# Patient Record
Sex: Male | Born: 1995
Health system: Southern US, Community
[De-identification: ages and names within clinical notes are randomized; demographics above are authoritative.]

## PROBLEM LIST (undated history)

## (undated) DIAGNOSIS — F32A Depression, unspecified: Secondary | ICD-10-CM

## (undated) DIAGNOSIS — F419 Anxiety disorder, unspecified: Secondary | ICD-10-CM

## (undated) DIAGNOSIS — M459 Ankylosing spondylitis of unspecified sites in spine: Secondary | ICD-10-CM

## (undated) DIAGNOSIS — M461 Sacroiliitis, not elsewhere classified: Secondary | ICD-10-CM

## (undated) DIAGNOSIS — F329 Major depressive disorder, single episode, unspecified: Secondary | ICD-10-CM

## (undated) HISTORY — PX: NO PAST SURGERIES: SHX2092

## (undated) HISTORY — DX: Major depressive disorder, single episode, unspecified: F32.9

## (undated) HISTORY — DX: Ankylosing spondylitis of unspecified sites in spine: M45.9

## (undated) HISTORY — DX: Depression, unspecified: F32.A

## (undated) HISTORY — DX: Anxiety disorder, unspecified: F41.9

---

## 2014-06-28 ENCOUNTER — Ambulatory Visit (HOSPITAL_COMMUNITY): Payer: Self-pay | Admitting: Psychiatry

## 2015-01-02 ENCOUNTER — Other Ambulatory Visit: Payer: Self-pay | Admitting: Orthopedic Surgery

## 2015-01-02 DIAGNOSIS — M24159 Other articular cartilage disorders, unspecified hip: Secondary | ICD-10-CM

## 2015-01-02 DIAGNOSIS — M169 Osteoarthritis of hip, unspecified: Principal | ICD-10-CM

## 2015-01-17 ENCOUNTER — Ambulatory Visit
Admission: RE | Admit: 2015-01-17 | Discharge: 2015-01-17 | Disposition: A | Discharge status: Home / Self Care | Payer: BLUE CROSS/BLUE SHIELD | Source: Ambulatory Visit | Attending: Orthopedic Surgery | Admitting: Orthopedic Surgery

## 2015-01-17 DIAGNOSIS — M24159 Other articular cartilage disorders, unspecified hip: Secondary | ICD-10-CM

## 2015-01-17 DIAGNOSIS — M169 Osteoarthritis of hip, unspecified: Principal | ICD-10-CM

## 2015-01-17 MED ORDER — IOHEXOL 180 MG/ML  SOLN
15.0000 mL | Freq: Once | INTRAMUSCULAR | Status: DC | PRN
  Administered 2015-01-17: 15 mL via INTRAVENOUS

## 2015-01-21 ENCOUNTER — Other Ambulatory Visit: Payer: Self-pay

## 2015-02-05 ENCOUNTER — Ambulatory Visit
Admission: RE | Admit: 2015-02-05 | Discharge: 2015-02-05 | Disposition: A | Discharge status: Home / Self Care | Payer: BLUE CROSS/BLUE SHIELD | Source: Ambulatory Visit | Attending: Orthopedic Surgery | Admitting: Orthopedic Surgery

## 2015-02-05 DIAGNOSIS — M169 Osteoarthritis of hip, unspecified: Principal | ICD-10-CM

## 2015-02-05 DIAGNOSIS — M24159 Other articular cartilage disorders, unspecified hip: Secondary | ICD-10-CM

## 2015-02-05 MED ORDER — IOHEXOL 180 MG/ML  SOLN
9.0000 mL | Freq: Once | INTRAMUSCULAR | Status: AC | PRN
  Administered 2015-02-05: 9 mL via INTRA_ARTICULAR

## 2016-02-25 ENCOUNTER — Encounter: Payer: Self-pay | Admitting: Rheumatology

## 2016-10-12 ENCOUNTER — Emergency Department (HOSPITAL_COMMUNITY)
Admission: EM | Admit: 2016-10-12 | Discharge: 2016-10-12 | Disposition: A | Discharge status: Home / Self Care | Payer: BLUE CROSS/BLUE SHIELD | Attending: Physician Assistant | Admitting: Physician Assistant

## 2016-10-12 DIAGNOSIS — Z0000 Encounter for general adult medical examination without abnormal findings: Secondary | ICD-10-CM

## 2016-10-12 DIAGNOSIS — Z0389 Encounter for observation for other suspected diseases and conditions ruled out: Secondary | ICD-10-CM | POA: Insufficient documentation

## 2016-10-12 LAB — LIPASE, BLOOD: Lipase: 33 U/L (ref 11–51)

## 2016-10-12 LAB — CBC
HCT: 43 % (ref 39.0–52.0)
Hemoglobin: 14.9 g/dL (ref 13.0–17.0)
MCH: 31.8 pg (ref 26.0–34.0)
MCHC: 34.7 g/dL (ref 30.0–36.0)
MCV: 91.7 fL (ref 78.0–100.0)
PLATELETS: 232 10*3/uL (ref 150–400)
RBC: 4.69 MIL/uL (ref 4.22–5.81)
RDW: 13.1 % (ref 11.5–15.5)
WBC: 5.2 10*3/uL (ref 4.0–10.5)

## 2016-10-12 LAB — COMPREHENSIVE METABOLIC PANEL
ALT: 12 U/L — AB (ref 17–63)
AST: 25 U/L (ref 15–41)
Albumin: 4.3 g/dL (ref 3.5–5.0)
Alkaline Phosphatase: 57 U/L (ref 38–126)
Anion gap: 9 (ref 5–15)
BILIRUBIN TOTAL: 0.6 mg/dL (ref 0.3–1.2)
BUN: 8 mg/dL (ref 6–20)
CO2: 26 mmol/L (ref 22–32)
CREATININE: 0.82 mg/dL (ref 0.61–1.24)
Calcium: 9.7 mg/dL (ref 8.9–10.3)
Chloride: 103 mmol/L (ref 101–111)
GFR calc Af Amer: >60 mL/min (ref >60)
GLUCOSE: 85 mg/dL (ref 65–99)
Potassium: 4 mmol/L (ref 3.5–5.1)
Sodium: 138 mmol/L (ref 135–145)
Total Protein: 7 g/dL (ref 6.5–8.1)

## 2016-10-12 NOTE — Discharge Instructions (Signed)
Do not hesitate to return to the emergency room for any new, worsening or concerning symptoms. ° °Please obtain primary care using resource guide below. Let them know that you were seen in the emergency room and that they will need to obtain records for further outpatient management. ° ° °

## 2016-10-12 NOTE — ED Triage Notes (Signed)
Pt states for the last week he has been noticing when he drinks beer he has pain in his RUQ, also states he had mono a few weeks ago and is concerned about his liver.

## 2016-10-12 NOTE — ED Provider Notes (Signed)
[facility]-EMERGENCY DEPT Provider Note   CSN: [id number] Arrival date & time: 10/12/16  1242     History   Chief Complaint Chief Complaint  Patient presents with  . Abdominal Pain     HPI  Blood pressure 112/66, pulse 92, temperature 98 F (36.7 C), temperature source Oral, resp. rate 16, height 5\' 11"  (1.803 m), weight 72.6 kg (160 lb), SpO2 100 %.  Carl Lewis is a 21 y.o. male complaining of liver inflammation. He was diagnosed with mono 2 months ago and he states when he drinks alcohol he feels that his right upper quadrant becomes puffy. There is some mild discomfort but no pain, no fever, chills, nausea or vomiting. He has normal appetite and normal bowel and bladder habits. No acetaminophen use.  No past medical history on file.  There are no active problems to display for this patient.   No past surgical history on file.     Home Medications    Prior to Admission medications   Not on File    Family History No family history on file.  Social History Social History  Substance Use Topics  . Smoking status: Not on file  . Smokeless tobacco: Not on file  . Alcohol use Not on file     Allergies   Patient has no known allergies.   Review of Systems Review of Systems  A complete review of systems was obtained and all systems are negative except as noted in the HPI and PMH.   Physical Exam Updated Vital Signs BP 112/66 (BP Location: Right Arm)   Pulse 92   Temp 98 F (36.7 C) (Oral)   Resp 16   Ht 5\' 11"  (1.803 m)   Wt 72.6 kg (160 lb)   SpO2 100%   BMI 22.32 kg/m   Physical Exam  Constitutional: He is oriented to person, place, and time. He appears well-developed and well-nourished. No distress.  HENT:  Head: Normocephalic and atraumatic.  Mouth/Throat: Oropharynx is clear and moist.  Eyes: Pupils are equal, round, and reactive to light. Conjunctivae and EOM are normal.  Neck: Normal range of motion.  Cardiovascular: Normal rate, regular  rhythm and intact distal pulses.   Pulmonary/Chest: Effort normal and breath sounds normal.  Abdominal: Soft. He exhibits no distension and no mass. There is no tenderness. There is no rebound and no guarding. No hernia.  Musculoskeletal: Normal range of motion.  Neurological: He is alert and oriented to person, place, and time.  Skin: Capillary refill takes less than 2 seconds. He is not diaphoretic.  Psychiatric: He has a normal mood and affect.  Nursing note and vitals reviewed.    ED Treatments / Results  Labs (all labs ordered are listed, but only abnormal results are displayed) Labs Reviewed  COMPREHENSIVE METABOLIC PANEL - Abnormal; Notable for the following:       Result Value   ALT 12 (*)    All other components within normal limits  LIPASE, BLOOD  CBC  URINALYSIS, ROUTINE W REFLEX MICROSCOPIC    EKG  EKG Interpretation None       Radiology No results found.  Procedures Procedures (including critical care time)  Medications Ordered in ED Medications - No data to display   Initial Impression / Assessment and Plan / ED Course  I have reviewed the triage vital signs and the nursing notes.  Pertinent labs & imaging results that were available during my care of the patient were reviewed by me and considered in  my medical decision making (see chart for details).     Vitals:   10/12/16 1307  BP: 112/66  Pulse: 92  Resp: 16  Temp: 98 F (36.7 C)  TempSrc: Oral  SpO2: 100%  Weight: 72.6 kg (160 lb)  Height: 5\' 11"  (1.803 m)    Medications - No data to display  Carl Lewis is 21 y.o. male presenting with Concern for liver inflammation, was diagnosed with mononucleosis 2 months ago. He feels that when he drinks alcohol the right upper quadrant swells, there is no real pain, fever, nausea vomiting, change in bowel or bladder habits. Blood work, vital signs and physical exam reassuring. Of advised him to avoid alcohol and acetaminophen for several months,  primary care referral given.  Evaluation does not show pathology that would require ongoing emergent intervention or inpatient treatment. Pt is hemodynamically stable and mentating appropriately. Discussed findings and plan with patient/guardian, who agrees with care plan. All questions answered. Return precautions discussed and outpatient follow up given.   Upon discharge patient states that he is been advised that he will need imaging, I've advised him that we can obtain a CAT scan or ultrasound here today but I don't feel that it is medically necessary. I have offered him imaging and he declines. He states that he would like a definitive diagnosis, we have had a very frank discussion in that I don't feel that there is any significant abnormality requiring any emergent intervention at this time. I have again offered him further testing and imaging and he has declined.    Final Clinical Impressions(s) / ED Diagnoses   Final diagnoses:  Normal physical exam    New Prescriptions New Prescriptions   No medications on file     Kaylyn Limisciotta, Wyona Neils, PA-C 10/12/16 1636    Adelyn Roscher, Joni Reiningicole, PA-C 10/12/16 1654    Mackuen, Cindee Saltourteney Lyn, MD 10/12/16 [phone]

## 2016-10-12 NOTE — ED Notes (Signed)
The PA discussed the patient's lab results with the patient and offered to order further testing at the patient's request, but the patient declined. DC instructions reviewed with patient and pt now understands his DC instructions and will follow up at [facility].

## 2016-10-12 NOTE — ED Notes (Signed)
Pt displeased that he is not getting a CT or an Ultrasound, PA informed and now at bedside.

## 2016-10-12 NOTE — ED Notes (Signed)
PA advised pt did not need to be on monitor.

## 2016-10-31 ENCOUNTER — Other Ambulatory Visit: Payer: Self-pay | Admitting: Family Medicine

## 2016-10-31 ENCOUNTER — Encounter: Payer: Self-pay | Admitting: Family Medicine

## 2016-11-02 ENCOUNTER — Ambulatory Visit: Payer: BLUE CROSS/BLUE SHIELD | Admitting: Family Medicine

## 2016-11-16 ENCOUNTER — Ambulatory Visit: Payer: Medicaid Other | Admitting: Family Medicine

## 2016-11-17 IMAGING — MR MR HIP*R* W/CM
4 of 5 series · 23 of 40 positions shown · IV contrast (agent unspecified)
Comparison: MRI left hip 01/17/2015 reviewed.

CLINICAL DATA: Bilateral hip popping and pain for 1 year. Running
and walking long distances makes the patient's symptoms worse. No
known injury. Initial encounter.

EXAM:
MRI OF THE RIGHT HIP WITH CONTRAST(MR Arthrogram)
TECHNIQUE: Multiplanar, multisequence MR imaging of the hip was performed
immediately following contrast injection into the hip joint under
fluoroscopic guidance. No intravenous contrast was administered.

[Series 8: T2 fat-sat · coronal · right · 3.0mm · 0.78mm/px · 9 of 30 slices shown]
[im 1/30]
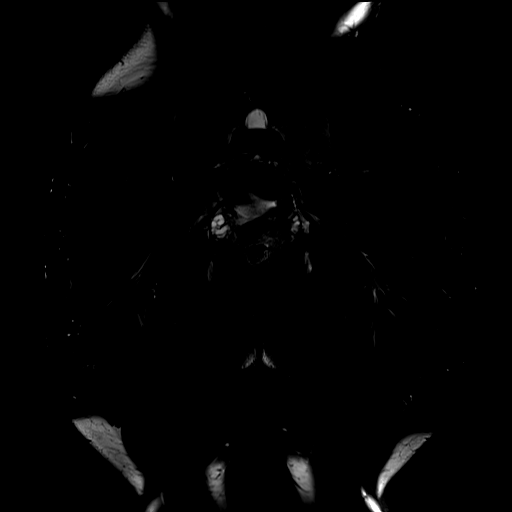
[im 4/30]
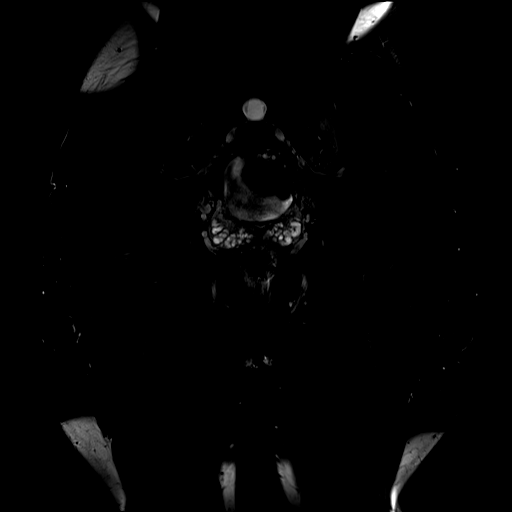
[im 8/30]
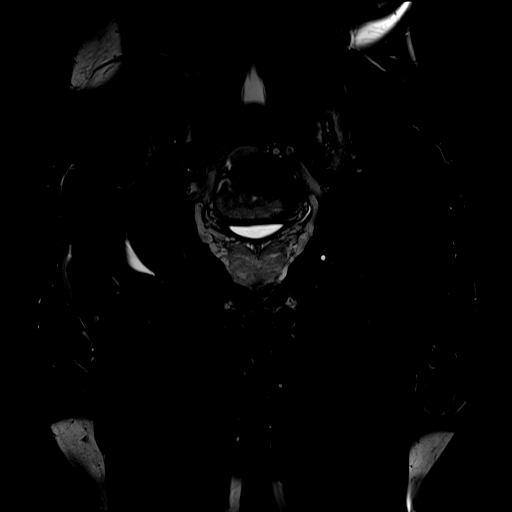
[im 11/30]
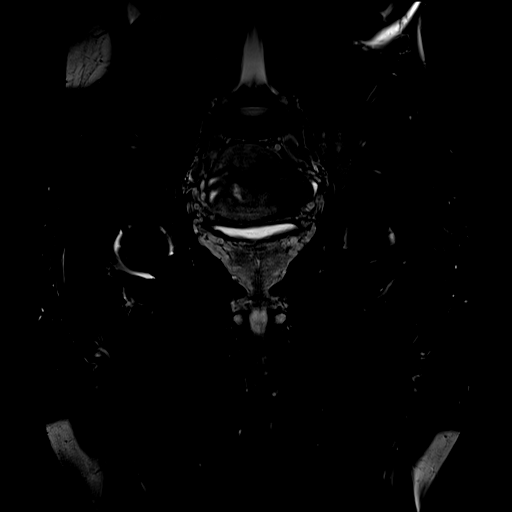
[im 15/30]
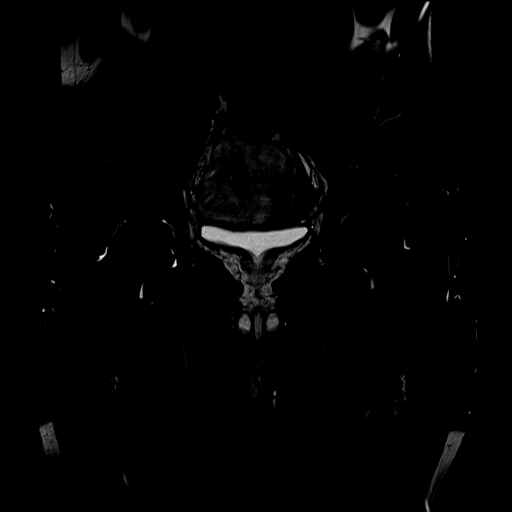
[im 19/30]
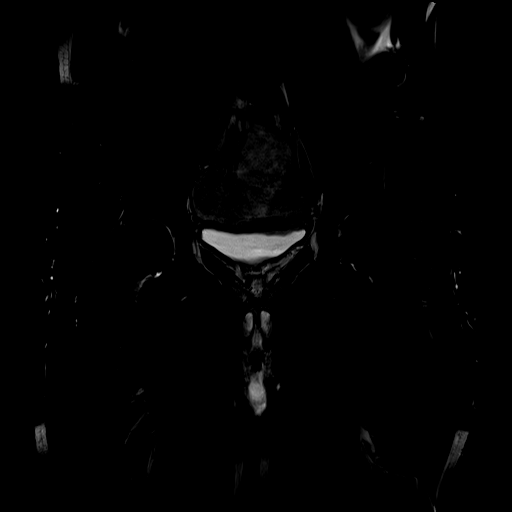
[im 22/30]
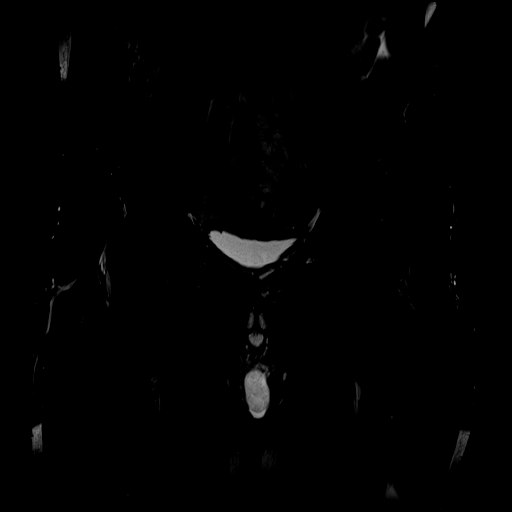
[im 26/30]
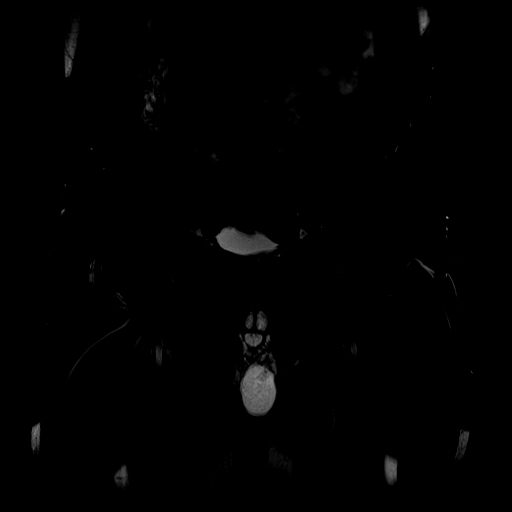
[im 30/30]
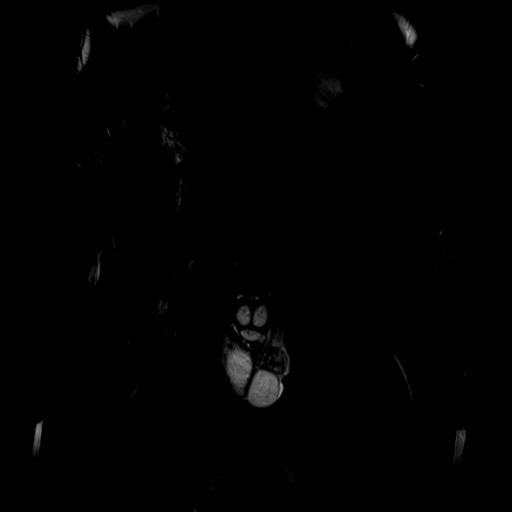

[Series 9: T1 · coronal · right · 3.0mm · 0.78mm/px · 8 of 30 slices shown]
[im 1/30]
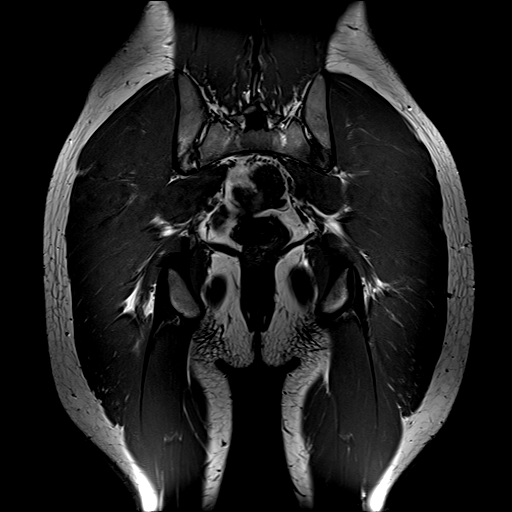
[im 5/30]
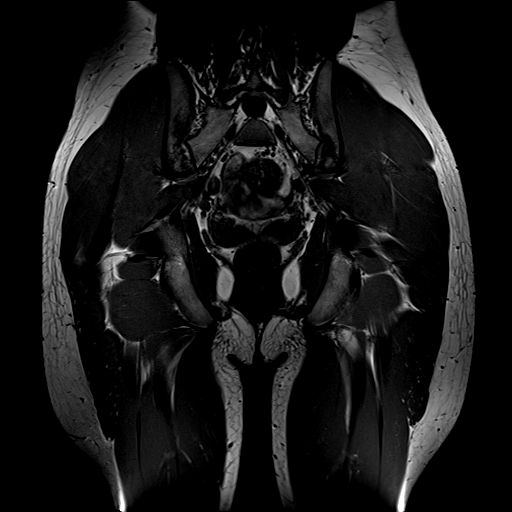
[im 9/30]
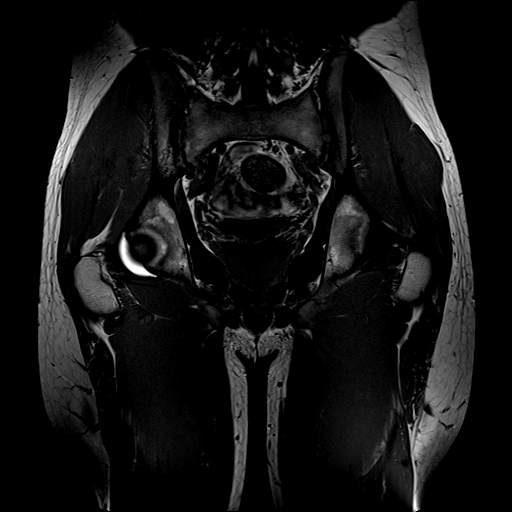
[im 13/30]
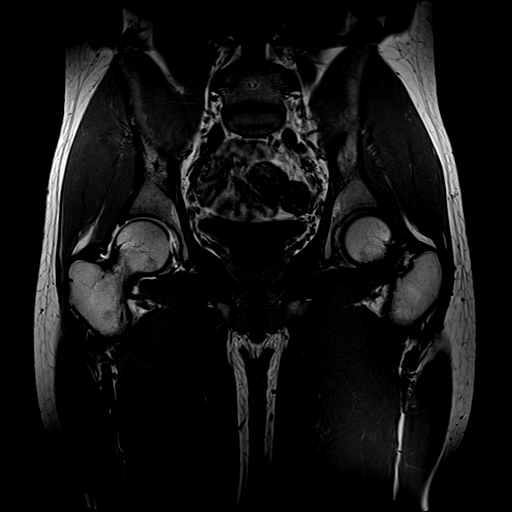
[im 17/30]
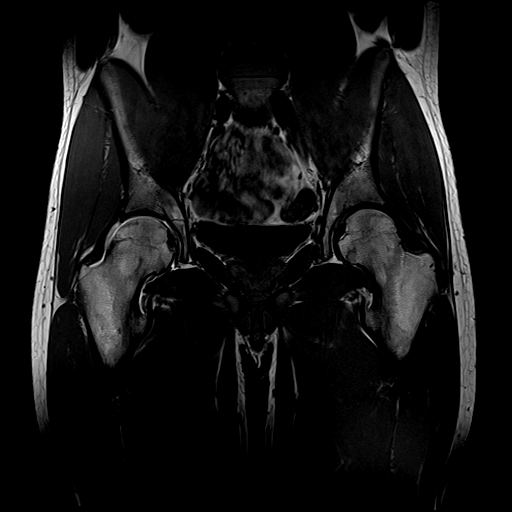
[im 21/30]
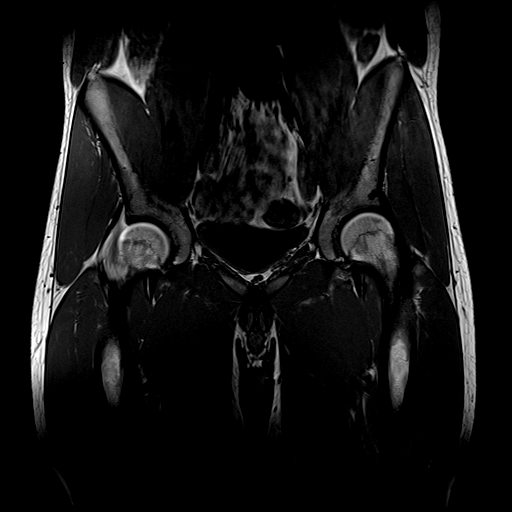
[im 25/30]
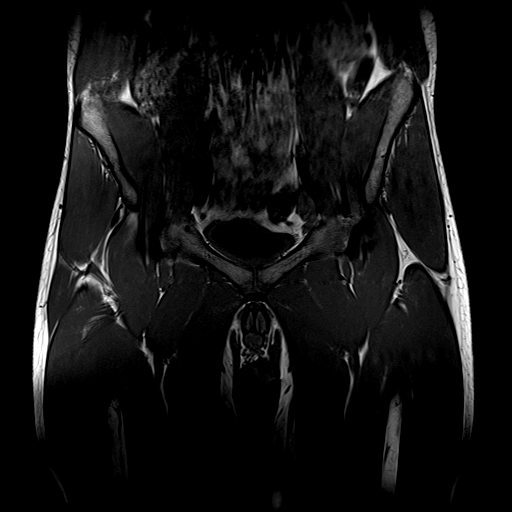
[im 30/30]
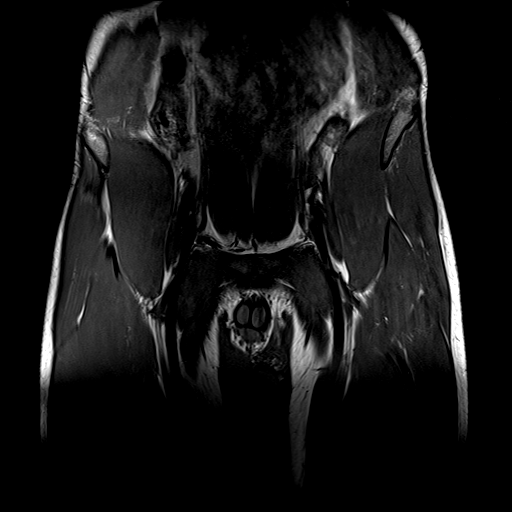

[Series 10: T1 fat-sat · axial · right · 3.0mm · 0.62mm/px · z∈[-98,-28]mm · 3 of 31 slices shown (1 of 2)]
[im 5/31]
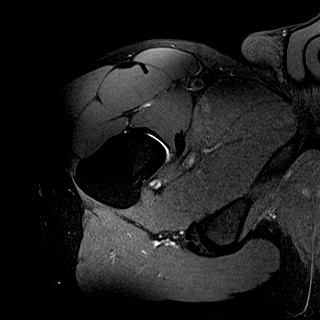
[im 18/31]
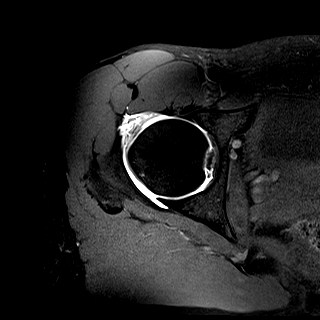
[im 26/31]
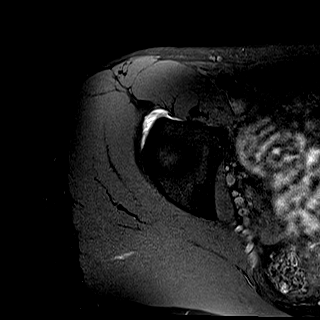

[Series 11: T1 fat-sat · coronal · right · 3.0mm · 0.62mm/px · 3 of 27 slices shown (2 of 2)]
[im 5/27]
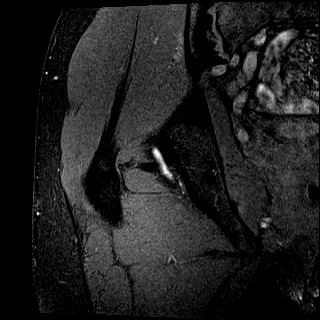
[im 14/27]
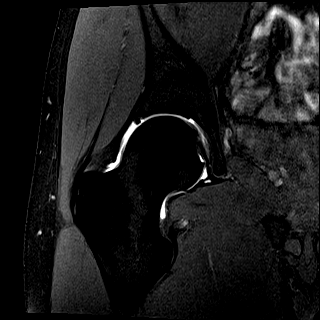
[im 22/27]
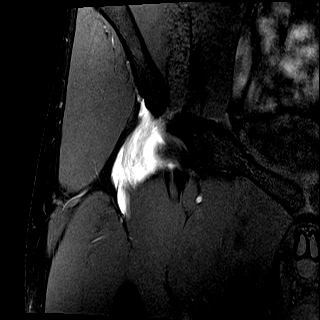

[23 of 40 positions shown; findings below may reference images not displayed]

FINDINGS: Bones: Erosions present about the sacroiliac joints bilaterally and
there is marrow edema about the joints. Changes may be slightly
worse on the left.

Articular cartilage and labrum

Articular cartilage:  Unremarkable.

Labrum:  Intact.

Bursal effusion

Bursae:  Unremarkable.

Muscles and tendons

Muscles and tendons:  Intact and normal appearance.

Other findings

Miscellaneous:  Imaged intrapelvic contents are unremarkable.
IMPRESSION: Dominant finding is bilateral sacroiliitis which may be slightly
worse on the left. Finding is worrisome for spondyloarthropathy such
as reactive arthritis or ankylosing spondylitis.

Negative for tear of the acetabular labrum.

## 2016-11-21 ENCOUNTER — Emergency Department (HOSPITAL_COMMUNITY)
Admission: EM | Admit: 2016-11-21 | Discharge: 2016-11-22 | Disposition: A | Discharge status: Home / Self Care | Payer: Self-pay | Attending: Emergency Medicine | Admitting: Emergency Medicine

## 2016-11-21 ENCOUNTER — Encounter (HOSPITAL_COMMUNITY): Payer: Self-pay | Admitting: Emergency Medicine

## 2016-11-21 DIAGNOSIS — R1011 Right upper quadrant pain: Secondary | ICD-10-CM | POA: Insufficient documentation

## 2016-11-21 DIAGNOSIS — F1020 Alcohol dependence, uncomplicated: Secondary | ICD-10-CM | POA: Insufficient documentation

## 2016-11-21 DIAGNOSIS — F17210 Nicotine dependence, cigarettes, uncomplicated: Secondary | ICD-10-CM | POA: Insufficient documentation

## 2016-11-21 DIAGNOSIS — R45851 Suicidal ideations: Secondary | ICD-10-CM | POA: Insufficient documentation

## 2016-11-21 DIAGNOSIS — R5383 Other fatigue: Secondary | ICD-10-CM | POA: Insufficient documentation

## 2016-11-21 DIAGNOSIS — J45909 Unspecified asthma, uncomplicated: Secondary | ICD-10-CM | POA: Insufficient documentation

## 2016-11-21 HISTORY — DX: Sacroiliitis, not elsewhere classified: M46.1

## 2016-11-21 LAB — COMPREHENSIVE METABOLIC PANEL
ALBUMIN: 4.4 g/dL (ref 3.5–5.0)
ALT: 13 U/L — AB (ref 17–63)
AST: 25 U/L (ref 15–41)
Alkaline Phosphatase: 57 U/L (ref 38–126)
Anion gap: 10 (ref 5–15)
BUN: 13 mg/dL (ref 6–20)
CHLORIDE: 102 mmol/L (ref 101–111)
CO2: 25 mmol/L (ref 22–32)
CREATININE: 0.95 mg/dL (ref 0.61–1.24)
Calcium: 9.3 mg/dL (ref 8.9–10.3)
GFR calc Af Amer: >60 mL/min (ref >60)
GFR calc non Af Amer: >60 mL/min (ref >60)
GLUCOSE: 113 mg/dL — AB (ref 65–99)
Potassium: 3.5 mmol/L (ref 3.5–5.1)
SODIUM: 137 mmol/L (ref 135–145)
Total Bilirubin: 0.4 mg/dL (ref 0.3–1.2)
Total Protein: 7.5 g/dL (ref 6.5–8.1)

## 2016-11-21 LAB — CBC
HCT: 42.2 % (ref 39.0–52.0)
Hemoglobin: 14.3 g/dL (ref 13.0–17.0)
MCH: 31.2 pg (ref 26.0–34.0)
MCHC: 33.9 g/dL (ref 30.0–36.0)
MCV: 91.9 fL (ref 78.0–100.0)
PLATELETS: 227 10*3/uL (ref 150–400)
RBC: 4.59 MIL/uL (ref 4.22–5.81)
RDW: 12.6 % (ref 11.5–15.5)
WBC: 5.9 10*3/uL (ref 4.0–10.5)

## 2016-11-21 LAB — RAPID URINE DRUG SCREEN, HOSP PERFORMED
AMPHETAMINES: NOT DETECTED
BENZODIAZEPINES: NOT DETECTED
Barbiturates: NOT DETECTED
Cocaine: NOT DETECTED
OPIATES: NOT DETECTED
TETRAHYDROCANNABINOL: NOT DETECTED

## 2016-11-21 LAB — LIPASE, BLOOD: LIPASE: 30 U/L (ref 11–51)

## 2016-11-21 LAB — URINALYSIS, ROUTINE W REFLEX MICROSCOPIC
Bilirubin Urine: NEGATIVE
GLUCOSE, UA: NEGATIVE mg/dL
HGB URINE DIPSTICK: NEGATIVE
KETONES UR: NEGATIVE mg/dL
Leukocytes, UA: NEGATIVE
Nitrite: NEGATIVE
PH: 6 (ref 5.0–8.0)
PROTEIN: NEGATIVE mg/dL
Specific Gravity, Urine: 1.008 (ref 1.005–1.030)

## 2016-11-21 LAB — ETHANOL: Alcohol, Ethyl (B): <5 mg/dL (ref <5)

## 2016-11-21 LAB — ACETAMINOPHEN LEVEL: Acetaminophen (Tylenol), Serum: <10 ug/mL — ABNORMAL LOW (ref 10–30)

## 2016-11-21 LAB — SALICYLATE LEVEL: Salicylate Lvl: <7 mg/dL (ref 2.8–30.0)

## 2016-11-21 NOTE — ED Notes (Signed)
Belongings removed, pt changed into paper scrubs. Wanded by security. Staffing has been called, no sitter til AM. Consulting civil engineer aware.

## 2016-11-21 NOTE — ED Notes (Signed)
Pt has multiple questions r/t his RUQ abdominal pain.  Pt reports pain is worse after ETOH consumption as well as the consumption of certain foods.  Pt has multiple concerns about developing diabetes or permanently damaging his pancreas or liver.  Offered support to pt.  Suggested that pt abstain from ETOH and the foods that cause the pain as well as become established with a PCP for follow up.  Pt states that his SI is passive, however does have plan to hang himself.  Pt is calm and cooperative at this time.  Will continue to monitor.

## 2016-11-21 NOTE — ED Notes (Signed)
Pt reports he has had RUQ pain X several months, has been told there is nothing wrong but pain persists. Dull, 7/10. Denies N/V/D.  Also during triage questioning, when asked it pt was having thoughts of hurting himself he stated yes, he sometimes has a plan to hang himself. SI precautions initiated.

## 2016-11-22 NOTE — ED Notes (Signed)
TTS in progress at this time.  

## 2016-11-22 NOTE — ED Triage Notes (Signed)
PT may be DC home
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
## 2016-11-22 NOTE — Discharge Instructions (Signed)
Please follow up with the outpatient services recommended by the psychiatry team

## 2016-11-22 NOTE — Progress Notes (Signed)
Per Fransisca Kaufmann, NP, the patient does not meet criteria for inpatient treatment. Patient is recommended for discharge and to follow up with outpatient providers.    Patient requesting resources for outpatient services.   CSW faxed over resources to [facility] at [phone].    Celine Ahr, RN notified.   Baldo Daub MSW, LCSWA CSW Disposition [phone]

## 2016-11-22 NOTE — ED Provider Notes (Signed)
[facility]-EMER[facility]ENCY DEPT Provider Note   CSN: [id number] Arrival date & time: 11/21/16  2018     History   Chief Complaint Chief Complaint  Patient presents with  . Abdominal Pain  . Suicidal    HPI Carl Lewis is a 21 y.o. male.  The patient presents to the emergency department for evaluation of abdominal discomfort in RUQ that has been ongoing for about 3 months. He states his RUQ abdomen "swells" after he drinks alcohol. For the past 2 weeks he has noticed that shortly after he eats, he feels drained and has no energy. He has lost 6-7 pounds in the past 4 months. No nausea, vomiting, diarrhea or bowel movement changes. No fever, cough, SOB. He states that he notices symptoms of fatigue after eating things like bread or sugar. He also feels it causes an inability to get an erection. He states a concern for whether or not his drinking alcohol is contributing to a change in his body that is affecting him. He reports he does not drink everyday. He drinks every couple of days, sometimes "a beer or two, and sometimes a lot".   During routine screening through triage he answered "yes" to whether or not he wanted to hurt himself. He states that he would rather not continue to live if he was going to experience these symptoms related to diet. He further states he has considered how he would commit suicide, stating he would hang himself.     The history is provided by the patient. No language interpreter was used.  Abdominal Pain   Pertinent negatives include fever, diarrhea, nausea, vomiting, constipation, headaches and myalgias.    Past Medical History:  Diagnosis Date  . Anxiety and depression   . Sacroiliitis (HCC)     There are no active problems to display for this patient.   History reviewed. No pertinent surgical history.     Home Medications    Prior to Admission medications   Not on File    Family History Family History  Problem Relation Age of Onset  . Asthma  Mother   . Anxiety disorder Mother     Social History Social History  Substance Use Topics  . Smoking status: Current Some Day Smoker  . Smokeless tobacco: Never Used  . Alcohol use Yes     Comment: 5/week     Allergies   Patient has no known allergies.   Review of Systems Review of Systems  Constitutional: Positive for fatigue and unexpected weight change (See HPI.). Negative for chills and fever.  HENT: Negative.   Respiratory: Negative.  Negative for shortness of breath.   Cardiovascular: Negative.  Negative for chest pain.  Gastrointestinal: Positive for abdominal pain (No significant pain, just swelling and mild pressure). Negative for constipation, diarrhea, nausea and vomiting.  Genitourinary: Negative.   Musculoskeletal: Negative.  Negative for myalgias.  Skin: Negative.   Neurological: Negative.  Negative for syncope and headaches.     Physical Exam Updated Vital Signs BP (!) 125/93 (BP Location: Left Arm)   Pulse 97   Temp 98.7 F (37.1 C) (Oral)   Resp 18   Ht  (1.803 m)   Wt 68 kg (150 lb)   SpO2 99%   BMI 20.92 kg/m   Physical Exam  Constitutional: He is oriented to person, place, and time. He appears well-developed and well-nourished.  HENT:  Head: Normocephalic.  Neck: Normal range of motion. Neck supple.  Cardiovascular: Normal rate and regular rhythm.  Pulmonary/Chest: Effort normal and breath sounds normal. He has no wheezes. He has no rales.  Abdominal: Soft. Bowel sounds are normal. There is no hepatosplenomegaly. There is no tenderness. There is no rebound and no guarding.  Musculoskeletal: Normal range of motion.  Neurological: He is alert and oriented to person, place, and time.  Skin: Skin is warm and dry. No rash noted.  Psychiatric: He has a normal mood and affect.     ED Treatments / Results  Labs (all labs ordered are listed, but only abnormal results are displayed) Labs Reviewed  COMPREHENSIVE METABOLIC PANEL -  Abnormal; Notable for the following:       Result Value   Glucose, Bld 113 (*)    ALT 13 (*)    All other components within normal limits  URINALYSIS, ROUTINE W REFLEX MICROSCOPIC - Abnormal; Notable for the following:    Color, Urine STRAW (*)    All other components within normal limits  ACETAMINOPHEN LEVEL - Abnormal; Notable for the following:    Acetaminophen (Tylenol), Serum <10 (*)    All other components within normal limits  LIPASE, BLOOD  CBC  ETHANOL  SALICYLATE LEVEL  RAPID URINE DRUG SCREEN, HOSP PERFORMED    EKG  EKG Interpretation None       Radiology No results found.  Procedures Procedures (including critical care time)  Medications Ordered in ED Medications - No data to display   Initial Impression / Assessment and Plan / ED Course  I have reviewed the triage vital signs and the nursing notes.  Pertinent labs & imaging results that were available during my care of the patient were reviewed by me and considered in my medical decision making (see chart for details).     The patient is concerned about alcohol intake, RUQ symptoms per HPI, and symptoms he is experiencing related to eating certain foods. Chart reviewed. He was seen for similar concerns in August of this year. He has not had outpatient follow up.   No significant weight loss. No abnormal labs, including LFT's. VS normal. Discussed at length dietary intake, recent changes, possible resolutions. Discussed he should stop drinking for a period of weeks to see if this affects his symptoms. Also discussed the importance in outpatient follow up.  He does not feel unsafe going home. However, it is apparent he has considered suicide to the point of determining how he would carry it out. He has no psychiatric history, is in school for Counselling psychologist and has future plans. No HI/AVH. Will request TTS evaluation to determine safety for discharge.   Final Clinical Impressions(s) / ED Diagnoses    Final diagnoses:  None   1. Fatigue 2. SI  New Prescriptions New Prescriptions   No medications on file     Danne Harbor 11/22/16 Harle Battiest, MD 11/22/16 [phone]

## 2016-11-22 NOTE — ED Provider Notes (Signed)
Outpatient treatment recommended by TTS.   Stable for discharge.   Linwood Dibbles, MD 11/22/16 1213

## 2016-11-22 NOTE — ED Notes (Signed)
Declined W/C at D/C and was escorted to lobby by RN. 

## 2016-11-22 NOTE — ED Notes (Signed)
Per Ala Dach from [facility], pt meets inpatient criteria.  BH does not have a bed at this time so pt will be awaiting placement.
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
## 2016-11-22 NOTE — ED Notes (Signed)
A Regular Diet was ordered for Lunch, and Patient asked for a cup of water w/ No Ice.

## 2017-07-09 ENCOUNTER — Emergency Department (HOSPITAL_COMMUNITY)
Admission: EM | Admit: 2017-07-09 | Discharge: 2017-07-09 | Disposition: A | Discharge status: Home / Self Care | Payer: 59 | Attending: Emergency Medicine | Admitting: Emergency Medicine

## 2017-07-09 ENCOUNTER — Encounter (HOSPITAL_COMMUNITY): Payer: Self-pay | Admitting: Emergency Medicine

## 2017-07-09 ENCOUNTER — Emergency Department (HOSPITAL_COMMUNITY): Payer: 59

## 2017-07-09 ENCOUNTER — Other Ambulatory Visit: Payer: Self-pay

## 2017-07-09 DIAGNOSIS — R079 Chest pain, unspecified: Secondary | ICD-10-CM | POA: Diagnosis present

## 2017-07-09 DIAGNOSIS — R0781 Pleurodynia: Secondary | ICD-10-CM

## 2017-07-09 DIAGNOSIS — F17210 Nicotine dependence, cigarettes, uncomplicated: Secondary | ICD-10-CM | POA: Diagnosis not present

## 2017-07-09 DIAGNOSIS — Z79899 Other long term (current) drug therapy: Secondary | ICD-10-CM | POA: Insufficient documentation

## 2017-07-09 DIAGNOSIS — R071 Chest pain on breathing: Secondary | ICD-10-CM | POA: Diagnosis not present

## 2017-07-09 LAB — BASIC METABOLIC PANEL
Anion gap: 9 (ref 5–15)
BUN: 13 mg/dL (ref 6–20)
CALCIUM: 9.8 mg/dL (ref 8.9–10.3)
CO2: 27 mmol/L (ref 22–32)
Chloride: 102 mmol/L (ref 101–111)
Creatinine, Ser: 0.93 mg/dL (ref 0.61–1.24)
GFR calc Af Amer: >60 mL/min (ref >60)
GLUCOSE: 100 mg/dL — AB (ref 65–99)
Potassium: 3.5 mmol/L (ref 3.5–5.1)
SODIUM: 138 mmol/L (ref 135–145)

## 2017-07-09 LAB — CBC
HEMATOCRIT: 39.4 % (ref 39.0–52.0)
Hemoglobin: 13.8 g/dL (ref 13.0–17.0)
MCH: 32.4 pg (ref 26.0–34.0)
MCHC: 35 g/dL (ref 30.0–36.0)
MCV: 92.5 fL (ref 78.0–100.0)
PLATELETS: 240 10*3/uL (ref 150–400)
RBC: 4.26 MIL/uL (ref 4.22–5.81)
RDW: 11.8 % (ref 11.5–15.5)
WBC: 8.5 10*3/uL (ref 4.0–10.5)

## 2017-07-09 LAB — I-STAT TROPONIN, ED: TROPONIN I, POC: 0 ng/mL (ref 0.00–0.08)

## 2017-07-09 LAB — D-DIMER, QUANTITATIVE: D-Dimer, Quant: 0.38 ug/mL-FEU (ref 0.00–0.50)

## 2017-07-09 MED ORDER — NAPROXEN 500 MG PO TABS: 500.0000 mg | ORAL_TABLET | Freq: Two times a day (BID) | ORAL | 0 refills | Status: DC

## 2017-07-09 MED ORDER — KETOROLAC TROMETHAMINE 60 MG/2ML IM SOLN
30.0000 mg | Freq: Once | INTRAMUSCULAR | Status: AC
  Administered 2017-07-09: 30 mg via INTRAMUSCULAR
  Filled 2017-07-09: qty 2

## 2017-07-09 NOTE — ED Notes (Signed)
Nurse starting IV and will collect labs. 

## 2017-07-09 NOTE — Discharge Instructions (Addendum)
Your work-up today did not reveal a concerning cause of your chest pain.  We suspect that your symptoms may be due to chest wall inflammation.  Take naproxen as prescribed for symptomatic management.  You may also try over-the-counter Salonpas patches with lidocaine to apply topically for additional pain control.  Drink plenty of water as dehydration can contribute to symptoms.  Follow-up with a primary care doctor to ensure resolution of your symptoms.

## 2017-07-09 NOTE — ED Provider Notes (Signed)
[facility] EMERGENCY DEPARTMENT Provider Note   CSN: [id number] Arrival date & time: 07/09/17  0132    History   Chief Complaint Chief Complaint  Patient presents with  . Chest Pain    HPI Carl Lewis is a 22 y.o. male.  22 year old male presents to the emergency department for evaluation of chest pain.  He reports a sharp chest pain which is in his right lower chest and aggravated with deep breathing.  He states that he took ibuprofen prior to arrival with little improvement in his discomfort.  Symptoms began upon waking at 10 AM yesterday.  He has had a history of similar discomfort on a few occasions.  He states that this pain usually resolves spontaneously.  He has not had any associated shortness of breath, nausea, vomiting, dizziness, syncope or near syncope, leg swelling, hemoptysis, recent fevers or viral illness.  No history of recent surgeries or hospitalizations.  He denies any personal or family history of DVT/PE.  No use of testosterone replacement.  Patient denies any illicit drug use.     Past Medical History:  Diagnosis Date  . Anxiety and depression   . Sacroiliitis [facility])     Patient Active Problem List   Diagnosis Date Noted  . Alcohol use disorder, moderate, dependence (HCC) 11/22/2016    History reviewed. No pertinent surgical history.      Home Medications    Prior to Admission medications   Medication Sig Start Date End Date Taking? Authorizing Provider  ibuprofen (ADVIL,MOTRIN) 200 MG tablet Take 200 mg by mouth every 6 (six) hours as needed for fever or mild pain.   Yes [provider]  naproxen (NAPROSYN) 500 MG tablet Take 1 tablet (500 mg total) by mouth 2 (two) times daily. 07/09/17   Antony Madura, PA-C    Family History Family History  Problem Relation Age of Onset  . Asthma Mother   . Anxiety disorder Mother     Social History Social History   Tobacco Use  . Smoking status: Current Some Day Smoker  .  Smokeless tobacco: Never Used  Substance Use Topics  . Alcohol use: Yes    Comment: 5/week  . Drug use: Yes    Types: Marijuana, Benzodiazepines     Allergies   Patient has no known allergies.   Review of Systems Review of Systems Ten systems reviewed and are negative for acute change, except as noted in the HPI.    Physical Exam Updated Vital Signs BP 113/72 (BP Location: Right Arm)   Pulse 65   Temp 98.6 F (37 C) (Oral)   Resp (!) 22   Ht  (1.803 m)   Wt 72.6 kg (160 lb)   SpO2 100%   BMI 22.32 kg/m   Physical Exam  Constitutional: He is oriented to person, place, and time. He appears well-developed and well-nourished. No distress.  Nontoxic appearing  HENT:  Head: Normocephalic and atraumatic.  Eyes: Conjunctivae and EOM are normal. No scleral icterus.  Neck: Normal range of motion.  Cardiovascular: Normal rate, regular rhythm and intact distal pulses.  Pulmonary/Chest: Effort normal. No stridor. No respiratory distress. He has no wheezes. He has no rales.  Lungs clear to auscultation bilaterally  Abdominal: Soft. He exhibits no distension and no mass. There is no tenderness. There is no guarding.  Musculoskeletal: Normal range of motion.  Neurological: He is alert and oriented to person, place, and time. He exhibits normal muscle tone. Coordination normal.  Ambulatory  with steady gait  Skin: Skin is warm and dry. No rash noted. He is not diaphoretic. No erythema. No pallor.  Psychiatric: He has a normal mood and affect. His behavior is normal.  Nursing note and vitals reviewed.    ED Treatments / Results  Labs (all labs ordered are listed, but only abnormal results are displayed) Labs Reviewed  BASIC METABOLIC PANEL - Abnormal; Notable for the following components:      Result Value   Glucose, Bld 100 (*)    All other components within normal limits  CBC  D-DIMER, QUANTITATIVE (NOT AT [facility])  I-STAT TROPONIN, ED    EKG ED ECG REPORT   Date:  07/09/2017  Rate: 84  Rhythm: normal sinus rhythm  QRS Axis: normal  Intervals: normal  ST/T Wave abnormalities: normal  Conduction Disutrbances:none  Narrative Interpretation: Normal sinus rhythm.  No STEMI or ischemic change.  Old EKG Reviewed: none available  I have personally reviewed the EKG tracing and agree with the computerized printout as noted.   Radiology Dg Chest 2 View  Result Date: 07/09/2017 CLINICAL DATA:  Chest pain EXAM: CHEST - 2 VIEW COMPARISON:  None. FINDINGS: The heart size and mediastinal contours are within normal limits. Both lungs are clear. The visualized skeletal structures are unremarkable. IMPRESSION: No active cardiopulmonary disease. Electronically Signed   By: Jasmine Pang M.D.   On: 07/09/2017 02:48    Procedures Procedures (including critical care time)  Medications Ordered in ED Medications  ketorolac (TORADOL) injection 30 mg (30 mg Intramuscular Given 07/09/17 0530)     Initial Impression / Assessment and Plan / ED Course  I have reviewed the triage vital signs and the nursing notes.  Pertinent labs & imaging results that were available during my care of the patient were reviewed by me and considered in my medical decision making (see chart for details).     Patient presents to the emergency department for evaluation of chest pain.  Low suspicion for cardiac etiology given reassuring workup today.  EKG is nonischemic and troponin negative.  Patient has a heart score of 0 consistent with low risk of acute coronary event.  Chest x-ray without evidence of mediastinal widening to suggest dissection.  No pneumothorax, pneumonia, pleural effusion.  Pulmonary embolus further considered; however, patient without tachycardia, tachypnea, dyspnea, hypoxia.  Patient is PERC negative.  Suspect symptoms to be secondary to musculoskeletal etiology as they are reproducible on palpation to the chest wall.  We will have the patient follow-up with a primary care  doctor on an outpatient basis.  He has been instructed to continue with supportive measures including anti-inflammatories and topical Salonpas patches.  Return precautions discussed and provided. Patient discharged in stable condition with no unaddressed concerns.   Final Clinical Impressions(s) / ED Diagnoses   Final diagnoses:  Pleuritic chest pain    ED Discharge Orders        Ordered    naproxen (NAPROSYN) 500 MG tablet  2 times daily     07/09/17 0554       Antony Madura, PA-C 07/09/17 0600    Ward, Layla Maw, DO 07/09/17 9604

## 2017-07-09 NOTE — ED Triage Notes (Signed)
Pt reports R sided CP X1 day, worse with deep breath. Denies SOB, N/V, dizziness, etc. No medical hx.

## 2017-10-11 ENCOUNTER — Encounter

## 2018-03-10 ENCOUNTER — Encounter: Payer: Self-pay | Admitting: Gastroenterology

## 2018-03-10 ENCOUNTER — Other Ambulatory Visit (INDEPENDENT_AMBULATORY_CARE_PROVIDER_SITE_OTHER): Payer: Self-pay

## 2018-03-10 ENCOUNTER — Ambulatory Visit (INDEPENDENT_AMBULATORY_CARE_PROVIDER_SITE_OTHER): Payer: Self-pay | Admitting: Gastroenterology

## 2018-03-10 VITALS — BP 112/70 | HR 62 | Ht 71.0 in | Wt 151.4 lb

## 2018-03-10 DIAGNOSIS — R1011 Right upper quadrant pain: Secondary | ICD-10-CM

## 2018-03-10 DIAGNOSIS — G8929 Other chronic pain: Secondary | ICD-10-CM

## 2018-03-10 DIAGNOSIS — R109 Unspecified abdominal pain: Secondary | ICD-10-CM

## 2018-03-10 DIAGNOSIS — K9049 Malabsorption due to intolerance, not elsewhere classified: Secondary | ICD-10-CM

## 2018-03-10 LAB — COMPREHENSIVE METABOLIC PANEL
ALBUMIN: 4.6 g/dL (ref 3.5–5.2)
ALT: 9 U/L (ref 0–53)
AST: 19 U/L (ref 0–37)
Alkaline Phosphatase: 56 U/L (ref 39–117)
BUN: 12 mg/dL (ref 6–23)
CALCIUM: 9.8 mg/dL (ref 8.4–10.5)
CHLORIDE: 103 meq/L (ref 96–112)
CO2: 27 meq/L (ref 19–32)
CREATININE: 0.87 mg/dL (ref 0.40–1.50)
GFR: 139.57 mL/min (ref >60.00)
Glucose, Bld: 81 mg/dL (ref 70–99)
Potassium: 3.9 mEq/L (ref 3.5–5.1)
Sodium: 139 mEq/L (ref 135–145)
Total Bilirubin: 0.4 mg/dL (ref 0.2–1.2)
Total Protein: 7.7 g/dL (ref 6.0–8.3)

## 2018-03-10 LAB — CBC
HEMATOCRIT: 42.1 % (ref 39.0–52.0)
Hemoglobin: 14.6 g/dL (ref 13.0–17.0)
MCHC: 34.6 g/dL (ref 30.0–36.0)
MCV: 93 fl (ref 78.0–100.0)
PLATELETS: 237 10*3/uL (ref 150.0–400.0)
RBC: 4.52 Mil/uL (ref 4.22–5.81)
RDW: 12.6 % (ref 11.5–15.5)
WBC: 7.2 10*3/uL (ref 4.0–10.5)

## 2018-03-10 LAB — LIPASE: Lipase: 39 U/L (ref 11.0–59.0)

## 2018-03-10 LAB — AMYLASE: Amylase: 58 U/L (ref 27–131)

## 2018-03-10 NOTE — Progress Notes (Signed)
GASTROENTEROLOGY OUTPATIENT CLINIC VISIT   Primary Care Provider Patient, No Pcp Per No address on file None  Referring Provider No referring provider defined for this encounter. The patient does not have a primary care provider  Patient Profile: Carl Lewis is a 23 y.o. male with a pmh significant for anxiety/MDD.  The patient presents to the [facility] for an evaluation and management of problem(s) noted below:  Problem List 1. RUQ abdominal pain   2. Right flank pain, chronic   3. Food intolerance in adult     History of Present Illness: This is the patient's first visit to the outpatient of our GI clinic.  Describes previously being seen by gastroenterology years ago in the setting of issues of rectal bleeding and hemorrhoids.  He describes having previous banding procedures for his hemorrhoids.  Currently is not having any issues with his hemorrhoids.  He has no primary care provider.  For the last 1-1/2 to 2 years the patient describes recurrent abdominal discomfort and right flank discomfort that radiates to the right back just under the ribs as well as to the left back just under the ribs.  At times he is felt discomfort in the midportion boring into the back where at times also he will just have pain in the back.  The patient describes not seeing any providers about this until recently.  He has taken ibuprofen up to 600 mg 1-2 times daily to try and help with the pain and it has relieved some of his discomfort at times.  He will take away the pain at least 80 to 90% of the time sometimes it will persist longer.  Patient states that at times when he does eat he may have an aggravation of his discomfort approximately 1 to 2 hours after he is eating.  He does not know any particular foods that cause his pain to be worse but he does have a history of describing issues digesting processed sugars.  Interestingly, the patient notes that there is worsened discomfort  when he flexes towards the right or to the left and/or actually placing his hand or someone massaging the area can actually be helpful for the discomfort.  The patient has not trialed any medications such as antacid medications.  His weight has been stable overall.  He has a normal formed, soft bowel movement on a daily basis without any evidence of melena or hematochezia.  He denies any dysuria or darkened urine.  He states he has recently felt his hemorrhoid but is not currently causing him any issues he will need to push it back in if it prolapses out.  The patient does not take any stool softeners or laxatives.  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  He states he recently went to a medical provider was told that his labs were normal.  The patient states that he can recall the symptoms occurring around the time that he developed mononucleosis.  He has never had an upper endoscopy.  GI Review of Systems Positive as above Negative for pyrosis, dysphagia, odynophagia, jaundice, change in appetite, diarrhea  Review of Systems General: Denies fevers/chills/weight loss HEENT: Denies oral lesions Cardiovascular: Denies chest pain Pulmonary: Denies shortness of breath Gastroenterological: See HPI Genitourinary: Denies darkened urine Hematological: Denies easy bruising Endocrine: Denies temperature intolerance Dermatological: Denies jaundice Psychological: Mood is stable Allergy & Immunology: Describes intolerance to complex sugars and processed sugars Musculoskeletal: Denies new arthralgias   Medications Current  Outpatient Medications  Medication Sig Dispense Refill  . buPROPion (WELLBUTRIN) 100 MG tablet Take 100 mg by mouth 2 (two) times daily.    Marland Kitchen. ibuprofen (ADVIL,MOTRIN) 200 MG tablet Take 200 mg by mouth every 6 (six) hours as needed for fever or mild pain.    . naproxen (NAPROSYN) 500 MG tablet Take 1 tablet (500 mg total) by mouth 2 (two) times daily. 30 tablet 0     No current facility-administered medications for this visit.     Allergies No Known Allergies  Histories Past Medical History:  Diagnosis Date  . Anxiety and depression   . Sacroiliitis (HCC)    History reviewed. No pertinent surgical history. Social History   Socioeconomic History  . Marital status: Single    Spouse name: Not on file  . Number of children: Not on file  . Years of education: Not on file  . Highest education level: Not on file  Occupational History    Comment: [employer]  Social Needs  . Financial resource strain: Not on file  . Food insecurity:    Worry: Not on file    Inability: Not on file  . Transportation needs:    Medical: Not on file    Non-medical: Not on file  Tobacco Use  . Smoking status: Current Some Day Smoker  . Smokeless tobacco: Never Used  Substance and Sexual Activity  . Alcohol use: Yes    Comment: 5/week  . Drug use: Yes    Types: Marijuana, Benzodiazepines  . Sexual activity: Yes    Partners: Female  Lifestyle  . Physical activity:    Days per week: Not on file    Minutes per session: Not on file  . Stress: Not on file  Relationships  . Social connections:    Talks on phone: Not on file    Gets together: Not on file    Attends religious service: Not on file    Active member of club or organization: Not on file    Attends meetings of clubs or organizations: Not on file    Relationship status: Not on file  . Intimate partner violence:    Fear of current or ex partner: Not on file    Emotionally abused: Not on file    Physically abused: Not on file    Forced sexual activity: Not on file  Other Topics Concern  . Not on file  Social History Narrative  . Not on file   Family History  Problem Relation Age of Onset  . Asthma Mother   . Anxiety disorder Mother   . Colon cancer Neg Hx   . Esophageal cancer Neg Hx   . Inflammatory bowel disease Neg Hx   . Liver disease Neg Hx   . Pancreatic cancer Neg Hx   .  Rectal cancer Neg Hx   . Stomach cancer Neg Hx    I have reviewed his medical, social, and family history in detail and updated the electronic medical record as necessary.    PHYSICAL EXAMINATION  BP 112/70   Pulse 62   Ht 5\' 11"  (1.803 m)   Wt 151 lb 6 oz (68.7 kg)   BMI 21.11 kg/m  Wt Readings from Last 3 Encounters:  03/10/18 151 lb 6 oz (68.7 kg)  07/09/17 160 lb (72.6 kg)  11/21/16 150 lb (68 kg)  GEN: NAD, appears stated age, doesn't appear chronically ill PSYCH: Cooperative, without pressured speech but does interrupt at times during interview EYE:  Conjunctivae pink, sclerae anicteric ENT: MMM, without oral ulcers, no erythema or exudates noted NECK: Supple CV: RR without R/Gs  RESP: CTAB posteriorly, without wheezing GI: NABS, soft, NT/ND, without rebound or guarding, no HSM appreciated MSK/EXT: No lower extremity edema SKIN: No jaundice NEURO:  Alert & Oriented x 3, no focal deficits   REVIEW OF DATA  I reviewed the following data at the time of this encounter:  GI Procedures and Studies  No relevant studies to review  Laboratory Studies  Reviewed labs that are in epic  Imaging Studies  No relevant studies to review   ASSESSMENT  Mr. Logan Boresvans is a 23 y.o. male  with a pmh significant for anxiety/MDD.  The patient is seen today for evaluation and management of:  1. RUQ abdominal pain   2. Right flank pain, chronic   3. Food intolerance in adult    The patient is hemodynamically stable.  When hearing the patient's clinical history seems that this patient's symptoms are more likely a result of a musculoskeletal component rather than a deeper intra-abdominal process.  The patient is adamant that this must be his gallbladder causing issues or his liver having inflammation however there is never been evidence of abnormal liver test per his report he is never been told that he has had pancreatitis or anything from that standpoint.  He describes no significant imaging  having been done either.  At this point is not unreasonable to consider the possibility of underlying liver or gallbladder issues however my pretest probability for this is extremely low.  Patient may benefit from an upper endoscopy at some point in time was a PPI trial.  For now the patient has agreed to an abdominal ultrasound.   PLAN  Laboratories as outlined below Ultrasound abdomen to evaluate for abnormalities Consider a 6 to high-dose PPI trial based on results Not typical for H. pylori but may consider sending an H. pylori stool antigen before PPIs initiated Follow-up to be dictated based on symptoms and laboratories Patient would like to defer on endoscopic evaluations for now   Orders Placed This Encounter  Procedures  . US Abdomen Complete  . CBC  . Comprehensive metabolic panel  . Amylase  . Lipase    New Prescriptions   No medications on file   Modified Medications   No medications on file    Planned Follow Up: No follow-ups on file.   Corliss ParishGabriel Mansouraty, MD [facility] Advanced Endoscopy Office # [phone]

## 2018-03-10 NOTE — Patient Instructions (Signed)
You have been scheduled for an abdominal ultrasound at [facility]iology (1st floor of hospital) on 03/20/18 at 10am. Please arrive 15 minutes prior to your appointment for registration. Make certain not to have anything to eat or drink 6 hours prior to your appointment. Should you need to reschedule your appointment, please contact radiology at [phone]. This test typically takes about 30 minutes to perform.  Your provider has requested that you go to the basement level for lab work before leaving today. Press "B" on the elevator. The lab is located at the first door on the left as you exit the elevator.  Thank you for entrusting me with your care and choosing [facility].  Dr Meridee Score

## 2018-03-16 ENCOUNTER — Encounter: Payer: Self-pay | Admitting: Gastroenterology

## 2018-03-16 DIAGNOSIS — K9049 Malabsorption due to intolerance, not elsewhere classified: Secondary | ICD-10-CM | POA: Insufficient documentation

## 2018-03-16 DIAGNOSIS — G8929 Other chronic pain: Secondary | ICD-10-CM | POA: Insufficient documentation

## 2018-03-16 DIAGNOSIS — R1010 Upper abdominal pain, unspecified: Secondary | ICD-10-CM | POA: Insufficient documentation

## 2018-03-16 DIAGNOSIS — R109 Unspecified abdominal pain: Secondary | ICD-10-CM

## 2018-03-20 ENCOUNTER — Ambulatory Visit (HOSPITAL_COMMUNITY)
Admission: RE | Admit: 2018-03-20 | Discharge: 2018-03-20 | Disposition: A | Discharge status: Home / Self Care | Payer: BLUE CROSS/BLUE SHIELD | Source: Ambulatory Visit | Attending: Gastroenterology | Admitting: Gastroenterology

## 2018-03-20 DIAGNOSIS — R1011 Right upper quadrant pain: Secondary | ICD-10-CM | POA: Diagnosis present

## 2018-04-14 ENCOUNTER — Ambulatory Visit: Payer: BLUE CROSS/BLUE SHIELD | Admitting: Gastroenterology

## 2018-04-19 ENCOUNTER — Ambulatory Visit: Payer: BLUE CROSS/BLUE SHIELD | Admitting: Gastroenterology

## 2018-04-26 ENCOUNTER — Encounter (INDEPENDENT_AMBULATORY_CARE_PROVIDER_SITE_OTHER): Payer: Self-pay | Admitting: Orthopaedic Surgery

## 2018-04-26 ENCOUNTER — Ambulatory Visit (INDEPENDENT_AMBULATORY_CARE_PROVIDER_SITE_OTHER): Payer: BLUE CROSS/BLUE SHIELD

## 2018-04-26 ENCOUNTER — Ambulatory Visit (INDEPENDENT_AMBULATORY_CARE_PROVIDER_SITE_OTHER): Payer: BLUE CROSS/BLUE SHIELD | Admitting: Orthopaedic Surgery

## 2018-04-26 ENCOUNTER — Ambulatory Visit: Payer: BLUE CROSS/BLUE SHIELD | Admitting: Gastroenterology

## 2018-04-26 ENCOUNTER — Encounter: Payer: Self-pay | Admitting: Gastroenterology

## 2018-04-26 ENCOUNTER — Other Ambulatory Visit (INDEPENDENT_AMBULATORY_CARE_PROVIDER_SITE_OTHER): Payer: BLUE CROSS/BLUE SHIELD

## 2018-04-26 VITALS — BP 104/60 | HR 68 | Ht 70.0 in | Wt 154.0 lb

## 2018-04-26 DIAGNOSIS — M25552 Pain in left hip: Secondary | ICD-10-CM

## 2018-04-26 DIAGNOSIS — M25551 Pain in right hip: Secondary | ICD-10-CM | POA: Diagnosis not present

## 2018-04-26 DIAGNOSIS — E739 Lactose intolerance, unspecified: Secondary | ICD-10-CM

## 2018-04-26 DIAGNOSIS — R1010 Upper abdominal pain, unspecified: Secondary | ICD-10-CM

## 2018-04-26 DIAGNOSIS — M459 Ankylosing spondylitis of unspecified sites in spine: Secondary | ICD-10-CM

## 2018-04-26 DIAGNOSIS — R109 Unspecified abdominal pain: Secondary | ICD-10-CM

## 2018-04-26 DIAGNOSIS — G8929 Other chronic pain: Secondary | ICD-10-CM

## 2018-04-26 LAB — SEDIMENTATION RATE: Sed Rate: 25 mm/hr — ABNORMAL HIGH (ref 0–15)

## 2018-04-26 LAB — HIGH SENSITIVITY CRP: CRP HIGH SENSITIVITY: 9.92 mg/L — AB (ref 0.000–5.000)

## 2018-04-26 LAB — IGA: IgA: 387 mg/dL — ABNORMAL HIGH (ref 68–378)

## 2018-04-26 NOTE — Patient Instructions (Addendum)
   If you are age 24 or younger, your body mass index should be between 19-25. Your Body mass index is 22.1 kg/m. If this is out of the aformentioned range listed, please consider follow up with your Primary Care Provider.   Your provider has requested that you go to the basement level for lab work before leaving today. Press "B" on the elevator. The lab is located at the first door on the left as you exit the elevator.  You have been scheduled for a CT scan of the abdomen and pelvis at [facility] ([address]---this is in the same building as Press [facility]).   You are scheduled on 05/19/2018 at 9:30am. You should arrive 15 minutes prior to your appointment time for registration. Please follow the written instructions below on the day of your exam:  WARNING: IF YOU ARE ALLERGIC TO IODINE/X-RAY DYE, PLEASE NOTIFY RADIOLOGY IMMEDIATELY AT [phone]! YOU WILL BE GIVEN A 13 HOUR PREMEDICATION PREP.  1) Do not eat or drink anything after 5:30am  (4 hours prior to your test) 2) You have been given 2 bottles of oral contrast to drink. The solution may taste better if refrigerated, but do NOT add ice or any other liquid to this solution. Shake well before drinking.    Drink 1 bottle of contrast 7:30am   (2 hours prior to your exam)  Drink 1 bottle of contrast 8:30am   ( 1 hour prior to your exam)  You may take any medications as prescribed with a small amount of water, if necessary. If you take any of the following medications: METFORMIN, GLUCOPHAGE, GLUCOVANCE, AVANDAMET, RIOMET, FORTAMET, [place] MET, JANUMET, GLUMETZA or METAGLIP, you MAY be asked to HOLD this medication 48 hours AFTER the exam.  The purpose of you drinking the oral contrast is to aid in the visualization of your intestinal tract. The contrast solution may cause some diarrhea. Depending on your individual set of symptoms, you may also receive an intravenous injection of x-ray contrast/dye. Plan on being at  [facility] for 30 minutes or longer, depending on the type of exam you are having performed.  This test typically takes 30-45 minutes to complete.  If you have any questions regarding your exam or if you need to reschedule, you may call the CT department at [phone] between the hours of 8:00 am and 5:00 pm, Monday-Friday.    Thank you for choosing me and [facility].  Dr. Rush Landmark

## 2018-04-26 NOTE — Progress Notes (Signed)
Office Visit Note   Patient: Carl Lewis           Date of Birth: [birth year]           MRN: [id number] Visit Date: 04/26/2018              Requested by: No referring provider defined for this encounter. PCP: Patient, No Pcp Per   Assessment & Plan: Visit Diagnoses:  1. Pain in left hip   2. Pain in right hip   3. Ankylosing spondylitis, unspecified site of spine (HCC)     Plan: He absolutely does need to see and be tied into a rheumatologist they can help with a firm diagnosis and treatment for his autoimmune disorder.  We will work on hopefully get an appointment for him soon.  I would like to see if my partner Dr. Lajoyce Corners would see him for his feet for is a tight Achilles and his forefoot issues.  All question concerns were answered and addressed.  Follow-Up Instructions: We will make follow-up appointments with him with rheumatology and foot and ankle.  Orders:  Orders Placed This Encounter  Procedures  . XR HIPS BILAT W OR W/O PELVIS 2V  . Ambulatory referral to Rheumatology   No orders of the defined types were placed in this encounter.     Procedures: No procedures performed   Clinical Data: No additional findings.   Subjective: Chief Complaint  Patient presents with  . Left Hip - Pain  . Right Hip - Pain  The patient is someone I am seeing for the first time.  He is a very pleasant 23 year old gentleman with a known diagnosis I believe of ankylosing spondylitis.  He has pelvic MRIs from 2016 which showed both hips but also showed arthropathy which is quite significant at both SI joints.  He says he was diagnosed with an autoimmune issue before.  He is just not seen anybody physically related to a rheumatologic disorder.  He has recently had injections in his feet for plantar fasciitis.  He is a very thin 23 year old and is 23 year old gentleman but does report significant pain in his neck back and hips all the way down to his feet.  He denies any other acute medical  issues or chronic medical issues.  This is been going on for a long period of time.  He tries to manage with just anti-inflammatories when he can.  He does see a GI specialist as well.  Certainly rheumatologic issues can go hand-in-hand with GI issues.  His mother is with him today as well.  He is not a diabetic  HPI  Review of Systems .  He currently denies any headache, chest pain, shortness of breath, fever, chills, nausea, vomiting  Objective: Vital Signs: There were no vitals taken for this visit.  Physical Exam He is alert and oriented x3 and in no acute distress Ortho Exam Examination of both hips show the move fluidly without any issues at all and his range of motion is full.  There is no effusion of either knee joint in both knees move fluidly and are ligamentously stable.  He does have slightly high arches of both his feet and I do feel his Achilles are tight bilaterally.  I can dorsiflex his feet to just past neutral bilaterally.  He does have a lot of forefoot pain at his MTP joints and lesser toes as well at the MTP joints.  He walks without a limp.  Examination of  his neck shows full range of motion as does his lumbar and thoracic spines. Specialty Comments:  No specialty comments available.  Imaging: Xr Hips Bilat W Or W/o Pelvis 2v  Result Date: 04/26/2018 An AP pelvis and lateral both hips shows normal-appearing hips.  Comment cannot be made about the sacroiliac joints bilaterally due to difficulty visualizing due to other changes in the bowel.  MRIs of both hips from 2016 and showed normal-appearing hips but there is definitely some type of arthropathy of the SI joints bilaterally with the worst on the left than the right.  PMFS History: Patient Active Problem List   Diagnosis Date Noted  . RUQ abdominal pain 03/16/2018  . Right flank pain, chronic 03/16/2018  . Food intolerance in adult 03/16/2018  . Alcohol use disorder, moderate, dependence (HCC) 11/22/2016   Past  Medical History:  Diagnosis Date  . Anxiety and depression   . Sacroiliitis (HCC)     Family History  Problem Relation Age of Onset  . Asthma Mother   . Anxiety disorder Mother   . Colon cancer Neg Hx   . Esophageal cancer Neg Hx   . Inflammatory bowel disease Neg Hx   . Liver disease Neg Hx   . Pancreatic cancer Neg Hx   . Rectal cancer Neg Hx   . Stomach cancer Neg Hx     History reviewed. No pertinent surgical history. Social History   Occupational History    Comment: [employer]  Tobacco Use  . Smoking status: Current Some Day Smoker  . Smokeless tobacco: Never Used  Substance and Sexual Activity  . Alcohol use: Yes    Comment: 5/week  . Drug use: Yes    Types: Marijuana, Benzodiazepines  . Sexual activity: Yes    Partners: Female

## 2018-04-26 NOTE — Progress Notes (Signed)
[place] VISIT   Primary Care Provider Patient, No Pcp Per No address on file None No PCP  Patient Profile: Carl Lewis is a 23 y.o. male with a pmh significant for anxiety/MDD, question ankylosing spondylitis (work-up pending per patient report).  The patient presents to the [facility] for an evaluation and management of problem(s) noted below:  Problem List 1. Pain of upper abdomen   2. Right flank pain, chronic   3. Sugar intolerance - Self Reported     History of Present Illness Please see initial consultation note for full details of HPI.    Interval History Today, the patient returns for scheduled follow-up.  He is accompanied by his mother who is a Marine scientist.  The patient states that he has modified his diet by continuing to remain free from complex/compound sugars.  Insert doing this continues to allow him to have good function.  He will occasionally still use ibuprofen.  His discomfort is exacerbated at times with rotational movements.  We performed laboratories as well as ultrasound imaging which were grossly unremarkable.  His previous mononucleosis episode had caused him to develop spleen issues however his spleen is normal size currently.  His bile duct is normal in size and his liver tests were completely normal as they have been on multiple checks.  His mother asks whether a CCK HIDA should be performed to further evaluate his bile duct and gallbladder.  He denies any fevers or chills.  No new arthralgias.  He has started a new job.  He has seen his orthopedist who has initiated a possible work-up for ankylosing spondylitis per he and his mother's report-I do not have a record of these.  They report he is being referred to rheumatology for further evaluation.  There remains no postprandial component to his discomfort in his right upper quadrant/right flank as well as infrequent left flank pain.  He has not initiated any antacid  therapy.  GI Review of Systems Positive as above Negative for jaundice, change in appetite, weight loss, change in bowel habit, pyrosis, dysphagia   Review of Systems General: Denies fevers/chills HEENT: Denies oral lesions Cardiovascular: Denies chest pain Pulmonary: Denies shortness of breath Gastroenterological: See HPI Genitourinary: Denies darkened urine Hematological: Denies easy bruising Dermatological: Denies jaundice Psychological: Mood is stable Musculoskeletal: Denies new arthralgias but is seeking advice from his orthopedist   Medications Current Outpatient Medications  Medication Sig Dispense Refill  . buPROPion (WELLBUTRIN) 100 MG tablet Take 100 mg by mouth 2 (two) times daily.    Marland Kitchen ibuprofen (ADVIL,MOTRIN) 200 MG tablet Take 200 mg by mouth every 6 (six) hours as needed for fever or mild pain.    . naproxen (NAPROSYN) 500 MG tablet Take 1 tablet (500 mg total) by mouth 2 (two) times daily. 30 tablet 0   No current facility-administered medications for this visit.     Allergies No Known Allergies  Histories Past Medical History:  Diagnosis Date  . Ankylosing spondylitis ([place])   . Anxiety and depression   . Sacroiliitis [facility])    Past Surgical History:  Procedure Laterality Date  . NO PAST SURGERIES     Social History   Socioeconomic History  . Marital status: Single    Spouse name: Not on file  . Number of children: Not on file  . Years of education: Not on file  . Highest education level: Not on file  Occupational History    Comment: [employer]  Social Needs  .  Financial resource strain: Not on file  . Food insecurity:    Worry: Not on file    Inability: Not on file  . Transportation needs:    Medical: Not on file    Non-medical: Not on file  Tobacco Use  . Smoking status: Never Smoker  . Smokeless tobacco: Never Used  Substance and Sexual Activity  . Alcohol use: Yes    Comment: 5/week  . Drug use: Yes    Types: Marijuana,  Benzodiazepines  . Sexual activity: Yes    Partners: Female  Lifestyle  . Physical activity:    Days per week: Not on file    Minutes per session: Not on file  . Stress: Not on file  Relationships  . Social connections:    Talks on phone: Not on file    Gets together: Not on file    Attends religious service: Not on file    Active member of club or organization: Not on file    Attends meetings of clubs or organizations: Not on file    Relationship status: Not on file  . Intimate partner violence:    Fear of current or ex partner: Not on file    Emotionally abused: Not on file    Physically abused: Not on file    Forced sexual activity: Not on file  Other Topics Concern  . Not on file  Social History Narrative  . Not on file   Family History  Problem Relation Age of Onset  . Asthma Mother   . Anxiety disorder Mother   . Colon cancer Neg Hx   . Esophageal cancer Neg Hx   . Inflammatory bowel disease Neg Hx   . Liver disease Neg Hx   . Pancreatic cancer Neg Hx   . Rectal cancer Neg Hx   . Stomach cancer Neg Hx    I have reviewed his medical, social, and family history in detail and updated the electronic medical record as necessary.    PHYSICAL EXAMINATION  BP 104/60   Pulse 68   Ht '5\' 10"'$  (1.778 m)   Wt 154 lb (69.9 kg)   BMI 22.10 kg/m  Wt Readings from Last 3 Encounters:  04/26/18 154 lb (69.9 kg)  03/10/18 151 lb 6 oz (68.7 kg)  07/09/17 160 lb (72.6 kg)  GEN: NAD, appears stated age, doesn't appear chronically ill, accompanied by mother PSYCH: Cooperative, without pressured speech EYE: Conjunctivae pink, sclerae anicteric ENT: MMM CV: RR without R/Gs  RESP: CTAB posteriorly, without wheezing GI: NABS, soft, NT/ND, without rebound or guarding, no HSM appreciated MSK/EXT: No lower extremity edema SKIN: No jaundice NEURO:  Alert & Oriented x 3, no focal deficits   REVIEW OF DATA  I reviewed the following data at the time of this encounter:  GI  Procedures and Studies  No relevant studies to review  Laboratory Studies  Reviewed labs that are in epic  Imaging Studies  January 2020 abdominal ultrasound IMPRESSION: Normal abdominal ultrasound for age   ASSESSMENT  Mr. Homan is a 23 y.o. male with a pmh significant for anxiety/MDD, question ankylosing spondylitis (work-up pending per patient report).  The patient is seen today for evaluation and management of:  1. Pain of upper abdomen   2. Right flank pain, chronic   3. Sugar intolerance - Self Reported    This is a hemodynamically and clinically stable patient.  I continue to believe that the majority of his symptoms remain a musculoskeletal component rather than  a deeper intra-abdominal process.  He and his mother report being worked up for possible ankylosing spondylitis.  His abdominal ultrasound showed no evidence of significant organ abnormality.  He has done well with transitioning his diet and trying to stay away from complex sugars per his report.  By so doing he feels symptomatically better.  He has not kept a diary however of specific foods so that we can better understand types of sugars that he may "have intolerance to".  With that being said I do think it is worthwhile for [place] to do an H. pylori stool antigen.  I would also like the patient to strongly consider a 6-week period of high-dose PPI to see if he felt any difference in his symptoms.  I spoke with the patient and his mother I do not think that CCK HIDA evaluation for possible gallbladder dyskinesia is necessary at this point in time hence his symptoms are not prandial related but that I would even think the gallbladder is causing significant problems currently.  He has not manifested LFT abnormalities.  I recommend consideration of a CT abdomen/pelvis since his symptoms persist and possibly a upper endoscopy to evaluate his upper abdominal discomfort.  If these are negative then again I think we will have exonerated GI  abnormalities and is further work-up of ankylosing spondylitis or other rheumatologic issues.  Continue forward.  It is reasonable to obtain inflammatory markers to see if there is inflammation occurring at somewhere nonspecifically in the body and thus we will send a CRP and ESR.  I will send an ANA as well however any further additional work-up will have to be done by his orthopedist as well as his referring rheumatologist in the future.  If his cross-sectional CT shows any abnormalities we will obviously move forward with further diagnostics from there.  We should consider a diagnostic upper as noted above if her symptoms persist though again low pretest probability of finding an issue.  All patient questions were answered, to the best of my ability, and the patient agrees to the aforementioned plan of action with follow-up as indicated.   PLAN  Laboratories as outlined below H. pylori stool antigen Consider a 6-week period of high-dose PPI therapy If that is not successful then consider upper endoscopy for diagnostic purposes (low pretest probability at this point that this is GI in origin but to effectively rule out upper abdominal issues this work-up has been outlined to the patient and mother) Try to keep a diary of foods that cause him more issues so that we can try and find if there are certain sugars that may be causing him issues Not clear that SIBO evaluation is necessary at this point  Agree with further work-up for possible ankylosing spondylitis as is being outlined by his orthopedist and his upcoming rheumatology referral We will try and get records from his orthopedist   Orders Placed This Encounter  Procedures  . Helicobacter pylori special antigen  . CT Abdomen Pelvis W Contrast  . Sedimentation rate  . CRP High sensitivity  . IgA  . Tissue transglutaminase, IgA  . ANA    New Prescriptions   No medications on file   Modified Medications   No medications on file     Planned Follow Up: No follow-ups on file.   Justice Britain, MD [facility] Office # [phone]  Has decreased his sugar intake

## 2018-04-27 ENCOUNTER — Other Ambulatory Visit: Payer: Self-pay

## 2018-04-27 ENCOUNTER — Other Ambulatory Visit (INDEPENDENT_AMBULATORY_CARE_PROVIDER_SITE_OTHER): Payer: BLUE CROSS/BLUE SHIELD

## 2018-04-27 DIAGNOSIS — R1010 Upper abdominal pain, unspecified: Secondary | ICD-10-CM | POA: Diagnosis not present

## 2018-04-27 LAB — ANA: ANA: NEGATIVE

## 2018-04-27 LAB — TISSUE TRANSGLUTAMINASE, IGA: (tTG) Ab, IgA: 1 U/mL

## 2018-04-27 LAB — CK: Total CK: 248 U/L — ABNORMAL HIGH (ref 7–232)

## 2018-04-27 NOTE — Progress Notes (Unsigned)
Future orders added 

## 2018-04-28 NOTE — Progress Notes (Signed)
Office Visit Note  Patient: Carl Lewis             Date of Birth: 1996/01/03           MRN: [id number]             PCP: Patient, No Pcp Per Referring: Mcarthur Rossetti* Visit Date: 05/04/2018 Occupation: Engineer, technical sales, unemployed  Subjective:  Recurrent plantar fasciitis.   History of Present Illness: Carl Lewis is a 23 y.o. male seen in consultation per request of Dr. Ninfa Linden.  According to patient his symptoms started at age 42 with hip pain.  He states he took ibuprofen on PRN basis and symptoms improved over time.  He had occasional episodes with severe pain in the hip area to the point that he was having difficulty moving.  He states at age 87 he started having increased feet pain at the time he was seen by orthopedic surgeon in [place] who diagnosed him with plantar fasciitis and given cortisone injections which were helpful.  About 2 years ago he was seen at [facility] rheumatology where he recalls having x-rays and lab work.  He was diagnosed with ankylosing spondylitis and was given prednisone taper.  He states he did not get any refills and lost follow-up.  He was not given a follow-up appointment and was discharged from the office.  1 year ago his plantar fasciitis became more severe and more recurrent.  He was seen by podiatrist in [place] and had cortisone shot about 6 months ago which was helpful.  He states the pain is not as severe.  He denies any history of Achilles tendinitis although he has developed some tendinitis in his ankle.  He states over the last 1 month he has been having pain and swelling in the left foot which she describes over the ball of the foot.  He was seen by Dr. Ninfa Linden due to hip pain in the foot pain.  Dr. Ninfa Linden obtain x-ray of bilateral hip joints which were normal.  He states he currently has pain over bilateral trochanteric area SI joint area lower back occasional knee joint discomfort ankle joint pain and plantar fasciitis.  He also  mentioned that 2 years ago he was diagnosed with mononucleosis and since then he has had abdominal pain.  He has had 2 ER visits for abdominal pain and all the GI work-up has been negative so far.  He has been seeing a gastroenterologist.  He is a scheduled to have a CT scan of his abdomen.  He states he drinks alcohol about twice a month.  He denies any history of eye involvement or shortness of breath.  There is no family history of autoimmune disease.  Activities of Daily Living:  Patient reports morning stiffness for 30 minutes.   Patient Denies nocturnal pain.  Difficulty dressing/grooming: Denies Difficulty climbing stairs: Denies Difficulty getting out of chair: Denies Difficulty using hands for taps, buttons, cutlery, and/or writing: Denies  Review of Systems  Constitutional: Negative for fatigue.  HENT: Negative for mouth sores, mouth dryness and nose dryness.   Eyes: Negative for itching and dryness.  Respiratory: Negative for shortness of breath and wheezing.   Cardiovascular: Negative for chest pain and swelling in legs/feet.  Gastrointestinal: Negative for abdominal pain, constipation and diarrhea.  Endocrine: Negative for increased urination.  Genitourinary: Negative for painful urination.  Musculoskeletal: Positive for morning stiffness. Negative for arthralgias, joint pain and joint swelling.  Skin: Negative for rash.  Allergic/Immunologic: Negative for susceptible  to infections.  Neurological: Negative for dizziness, light-headedness, headaches, memory loss and weakness.  Hematological: Negative for bruising/bleeding tendency.  Psychiatric/Behavioral: Negative for confusion and sleep disturbance.    PMFS History:  Patient Active Problem List   Diagnosis Date Noted  . Pain of upper abdomen 03/16/2018  . Right flank pain, chronic 03/16/2018  . Food intolerance in adult 03/16/2018    Past Medical History:  Diagnosis Date  . Ankylosing spondylitis ([place])   . Anxiety  and depression   . Sacroiliitis ([place])     Family History  Problem Relation Age of Onset  . Asthma Mother   . Anxiety disorder Mother   . Arthritis Father   . Healthy Sister   . Colon cancer Neg Hx   . Esophageal cancer Neg Hx   . Inflammatory bowel disease Neg Hx   . Liver disease Neg Hx   . Pancreatic cancer Neg Hx   . Rectal cancer Neg Hx   . Stomach cancer Neg Hx    Past Surgical History:  Procedure Laterality Date  . NO PAST SURGERIES     Social History   Social History Narrative  . Not on file    There is no immunization history on file for this patient.   Objective: Vital Signs: BP 122/73 (BP Location: Right Arm, Patient Position: Sitting, Cuff Size: Normal)   Pulse 82   Resp 14   Ht '5\' 10"'$  (1.778 m)   Wt 155 lb (70.3 kg)   BMI 22.24 kg/m    Physical Exam Vitals signs and nursing note reviewed.  Constitutional:      Appearance: He is well-developed.  HENT:     Head: Normocephalic and atraumatic.  Eyes:     Conjunctiva/sclera: Conjunctivae normal.     Pupils: Pupils are equal, round, and reactive to light.  Neck:     Musculoskeletal: Normal range of motion and neck supple.  Cardiovascular:     Rate and Rhythm: Normal rate and regular rhythm.     Heart sounds: Normal heart sounds.  Pulmonary:     Effort: Pulmonary effort is normal.     Breath sounds: Normal breath sounds.  Abdominal:     General: Bowel sounds are normal.     Palpations: Abdomen is soft.  Skin:    General: Skin is warm and dry.     Capillary Refill: Capillary refill takes less than 2 seconds.  Neurological:     Mental Status: He is alert and oriented to person, place, and time.  Psychiatric:        Behavior: Behavior normal.      Musculoskeletal Exam: C-spine thoracic lumbar spine good range of motion.  He has good mobility in his lumbar spine.  Schober's was negative.  He had good posture with no kyphosis.  He had no tenderness over SI joints today.  He had no tenderness over  trochanteric bursa today.  Shoulder joints, elbow joints, wrist joints, MCPs PIPs and DIPs with good range of motion with no synovitis.  Hip joints, knee joints, ankles MTPs PIPs been good range of motion.  He had mild tenderness over the left third and fourth MTP joint without synovitis.  He also had tenderness over plantar fascia.  No evidence of Achilles tendinitis was noted.  CDAI Exam: CDAI Score: Not documented Patient Global Assessment: Not documented; Provider Global Assessment: Not documented Swollen: Not documented; Tender: Not documented Joint Exam   Not documented   There is currently no information documented on the  homunculus. Go to the Rheumatology activity and complete the homunculus joint exam.  Investigation: Findings:  04/26/18: ANA negative, CRP 9.92, Sed rate 25 04/27/18: CK 248  Component     Latest Ref Rng & Units 03/10/2018 04/26/2018 04/27/2018  Lipase     11.0 - 59.0 U/L 39.0    Amylase, Serum     27 - 131 U/L 58    Anti Nuclear Antibody(ANA)     NEGATIVE  NEGATIVE   (tTG) Ab, IgA     U/mL  1   IgA     68 - 378 mg/dL  387 (H)   C-Reactive Protein, Cardiac     0.000 - 5.000 mg/L  9.920 (H)   Sed Rate     0 - 15 mm/hr  25 (H)   CK Total     7 - 232 U/L   248 (H)   Imaging: Xr Hips Bilat W Or W/o Pelvis 2v  Result Date: 04/26/2018 An AP pelvis and lateral both hips shows normal-appearing hips.  Comment cannot be made about the sacroiliac joints bilaterally due to difficulty visualizing due to other changes in the bowel.   Recent Labs: Lab Results  Component Value Date   WBC 7.2 03/10/2018   HGB 14.6 03/10/2018   PLT 237.0 03/10/2018   NA 139 03/10/2018   K 3.9 03/10/2018   CL 103 03/10/2018   CO2 27 03/10/2018   GLUCOSE 81 03/10/2018   BUN 12 03/10/2018   CREATININE 0.87 03/10/2018   BILITOT 0.4 03/10/2018   ALKPHOS 56 03/10/2018   AST 19 03/10/2018   ALT 9 03/10/2018   PROT 7.7 03/10/2018   ALBUMIN 4.6 03/10/2018   CALCIUM 9.8 03/10/2018     GFRAA >60 07/09/2017  I reviewed x-rays done by Dr.Blackman.  SI joints were not visualized in that x-ray although the hip joint x-rays were unremarkable.  I also reviewed the MRI of his hip joint in the past which shows sacroiliitis and erosive changes in the SI joints.  Speciality Comments: No specialty comments available.  Procedures:  No procedures performed Allergies: Patient has no known allergies.   Assessment / Plan:     Visit Diagnoses: Ankylosing spondylitis, unspecified site of spine ([place]) - no MRI in EPIC2/19/20: ANA negative, CRP 9.92, Sed rate 252/20/20: CK 248.  Patient states he was diagnosed with ankylosing spondylitis by [facility] rheumatology in the past.  He was discharged from the office because of noncompliance with follow-up visits.  He complains of SI joint pain trochanteric bursitis and plantar fasciitis.  I will obtain following labs today.  Different treatment options and their side effects were discussed at length.  After reviewing indications side effects contraindications we decided to apply for Enbrel.  Handout was given and consent was taken.  Plantar fasciitis, bilateral-he gives history of recurrent plantar fasciitis for which he has had cortisone injections several times by orthopedic surgeons and podiatrist.  He still have some ongoing discomfort.  Pain in both feet -he has tenderness on palpation over left foot MTP joints.  No synovitis was noted.  Plan: XR Foot 2 Views Right, XR Foot 2 Views Left, Urinalysis, Routine w reflex microscopic, Uric acid, Cyclic citrul peptide antibody, IgG, Rheumatoid factor, HLA-B27 antigen  Sacroiliitis ([place]) -on review of his previous 2 MRIs there was sacroiliitis and erosive changes.  He states at times the pain is so severe that it is hard for him to do his routine activities.  Plan: XR Pelvis 1-2 Views.  The x-ray showed bilateral severe SI joint sclerosis and erosive changes.  High risk medication use -I had brief  discussion regarding treatment with anti-TNF's.  Indication side effects contraindications were discussed at length.  I will obtain following labs in anticipation to start him on therapy.  Plan: Hepatitis B surface antigen, Hepatitis C antibody, Hepatitis B core antibody, IgM, HIV Antibody (routine testing w rflx), QuantiFERON-TB Gold Plus, Serum protein electrophoresis with reflex, IgG, IgA, IgM, Glucose 6 phosphate dehydrogenase  Elevated CK -his CK was elevated.  We will check a CK TSH today.  Plan: Urinalysis, Routine w reflex microscopic, TSH, CK  Anxiety and depression -he is on medication currently.  Alcohol abuse-patient states now he only drinks alcohol twice a month.  Orders: Orders Placed This Encounter  Procedures  . XR Pelvis 1-2 Views  . XR Foot 2 Views Right  . XR Foot 2 Views Left  . Urinalysis, Routine w reflex microscopic  . Uric acid  . Cyclic citrul peptide antibody, IgG  . Rheumatoid factor  . HLA-B27 antigen  . Hepatitis B surface antigen  . Hepatitis C antibody  . Hepatitis B core antibody, IgM  . HIV Antibody (routine testing w rflx)  . QuantiFERON-TB Gold Plus  . Serum protein electrophoresis with reflex  . IgG, IgA, IgM  . Glucose 6 phosphate dehydrogenase  . TSH  . CK   No orders of the defined types were placed in this encounter.   Face-to-face time spent with patient was 60 minutes. Greater than 50% of time was spent in counseling and coordination of care.  Follow-Up Instructions: Return for Plantar fasciitis and sacroiliitis.   Bo Merino, MD  Note - This record has been created using Editor, commissioning.  Chart creation errors have been sought, but may not always  have been located. Such creation errors do not reflect on  the standard of medical care.

## 2018-05-02 ENCOUNTER — Ambulatory Visit (INDEPENDENT_AMBULATORY_CARE_PROVIDER_SITE_OTHER): Payer: BLUE CROSS/BLUE SHIELD | Admitting: Orthopedic Surgery

## 2018-05-04 ENCOUNTER — Encounter: Payer: Self-pay | Admitting: Rheumatology

## 2018-05-04 ENCOUNTER — Ambulatory Visit: Payer: BLUE CROSS/BLUE SHIELD | Admitting: Rheumatology

## 2018-05-04 ENCOUNTER — Telehealth: Payer: Self-pay | Admitting: Pharmacist

## 2018-05-04 ENCOUNTER — Ambulatory Visit (INDEPENDENT_AMBULATORY_CARE_PROVIDER_SITE_OTHER): Payer: Self-pay

## 2018-05-04 VITALS — BP 122/73 | HR 82 | Resp 14 | Ht 70.0 in | Wt 155.0 lb

## 2018-05-04 DIAGNOSIS — M79671 Pain in right foot: Secondary | ICD-10-CM | POA: Diagnosis not present

## 2018-05-04 DIAGNOSIS — F329 Major depressive disorder, single episode, unspecified: Secondary | ICD-10-CM

## 2018-05-04 DIAGNOSIS — M79672 Pain in left foot: Secondary | ICD-10-CM

## 2018-05-04 DIAGNOSIS — F1010 Alcohol abuse, uncomplicated: Secondary | ICD-10-CM

## 2018-05-04 DIAGNOSIS — M722 Plantar fascial fibromatosis: Secondary | ICD-10-CM | POA: Diagnosis not present

## 2018-05-04 DIAGNOSIS — M461 Sacroiliitis, not elsewhere classified: Secondary | ICD-10-CM

## 2018-05-04 DIAGNOSIS — F419 Anxiety disorder, unspecified: Secondary | ICD-10-CM

## 2018-05-04 DIAGNOSIS — M459 Ankylosing spondylitis of unspecified sites in spine: Secondary | ICD-10-CM

## 2018-05-04 DIAGNOSIS — R748 Abnormal levels of other serum enzymes: Secondary | ICD-10-CM

## 2018-05-04 DIAGNOSIS — Z79899 Other long term (current) drug therapy: Secondary | ICD-10-CM

## 2018-05-04 NOTE — Telephone Encounter (Signed)
Received a Prior Authorization request from  [employer] for ENBREL. Authorization has been submitted to patient's insurance via Cover My Meds. Will update once we receive a response.  Patient had commercial insurance and is copay card eligible.

## 2018-05-04 NOTE — Progress Notes (Signed)
Pharmacy Note  Subjective: Patient presents today to the [facility] to see Dr. Corliss Skains for new patient consultation.   Patient seen by the pharmacist for counseling on Enbrel for ankylosing spondylitis.  He was previously diagnosed by another rheumatologist but is treatment naive.   Objective:  CBC    Component Value Date/Time   WBC 7.2 03/10/2018 1557   RBC 4.52 03/10/2018 1557   HGB 14.6 03/10/2018 1557   HCT 42.1 03/10/2018 1557   PLT 237.0 03/10/2018 1557   MCV 93.0 03/10/2018 1557   MCH 32.4 07/09/2017 0149   MCHC 34.6 03/10/2018 1557   RDW 12.6 03/10/2018 1557     CMP     Component Value Date/Time   NA 139 03/10/2018 1557   K 3.9 03/10/2018 1557   CL 103 03/10/2018 1557   CO2 27 03/10/2018 1557   GLUCOSE 81 03/10/2018 1557   BUN 12 03/10/2018 1557   CREATININE 0.87 03/10/2018 1557   CALCIUM 9.8 03/10/2018 1557   PROT 7.7 03/10/2018 1557   ALBUMIN 4.6 03/10/2018 1557   AST 19 03/10/2018 1557   ALT 9 03/10/2018 1557   ALKPHOS 56 03/10/2018 1557   BILITOT 0.4 03/10/2018 1557   GFRNONAA >60 07/09/2017 0149   GFRAA >60 07/09/2017 0149     Baseline Immunosuppressant Therapy Labs TB gold: pending 05/04/18 HIV: pending 05/04/18 Hepatitis Panel:pending 05/04/18 Immunoglobulins:pending 05/04/18 SPEP:pending 05/04/18  Chest x-ray: no active cardiopulmonary disease 07/09/17  Does patient have diagnosis of heart failure?  No  Assessment/Plan:  Counseled patient that Enbrel is a TNF blocking agent. Counseled patient on purpose, proper use, and adverse effects of Enbrel.  Reviewed the most common adverse effects including infections, headache, and injection site reactions. Discussed that there is the possibility of an increased risk of malignancy but it is not well understood if this increased risk is due to the medication or the disease state.  Advised patient to get yearly dermatology exams due to risk of skin cancer.  Counseled patient that Enbrel should be  held prior to scheduled surgery.  Counseled patient to avoid live vaccines while on Enbrel. Recommend annual influenza, Pneumovax 23, Prevnar 13, and Shingrix as indicated.   Reviewed the importance of regular labs while on Enbrel therapy.  Advised patient to get standing labs one month after starting Enbrel then every 2 months.  Provided patient with standing lab orders.  Provided patient with medication education material and answered all questions.  Patient voiced understanding.   Patient is hesitant to start any medications that can decrease his immune system.  Informed patient that unfortunately all medications used to treat his condition carry that risk.  We do not have medications at this time that only target the "bad" immune system.  Patient verbalized understanding.  We will follow up with patient at new patient follow-up to see if he is ready to start treatment.  We will go ahead and apply for approval through insurance in order to not delay initiation.  All questions encouraged and answered.  Instructed patient to call with any further questions or concerns.  Verlin Fester, PharmD, [facility] Rheumatology Clinical Pharmacist  05/04/2018 10:45 AM

## 2018-05-04 NOTE — Telephone Encounter (Signed)
Please start benefits investigation for Enbrel.  He has ankylosing spondylitis and is naive to treatment.  His X-ray today showed SI joint sclerosis and erosive changes. History of alcohol abuse so do not want to start MTX.

## 2018-05-04 NOTE — Patient Instructions (Signed)
Etanercept injection  What is this medicine?  ETANERCEPT (et a NER sept) is used for the treatment of rheumatoid arthritis in adults and children. The medicine is also used to treat psoriatic arthritis, ankylosing spondylitis, and psoriasis.  This medicine may be used for other purposes; ask your health care provider or pharmacist if you have questions.  COMMON BRAND NAME(S): Enbrel  What should I tell my health care provider before I take this medicine?  They need to know if you have any of these conditions:  -blood disorders  -cancer  -congestive heart failure  -diabetes  -exposure to chickenpox  -immune system problems  -infection  -multiple sclerosis  -seizure disorder  -tuberculosis, a positive skin test for tuberculosis or have recently been in close contact with someone who has tuberculosis  -Wegener's granulomatosis  -an unusual or allergic reaction to etanercept, latex, other medicines, foods, dyes, or preservatives  -pregnant or trying to get pregnant  -breast-feeding  How should I use this medicine?  The medicine is given by injection under the skin. You will be taught how to prepare and give this medicine. Use exactly as directed. Take your medicine at regular intervals. Do not take your medicine more often than directed.  It is important that you put your used needles and syringes in a special sharps container. Do not put them in a trash can. If you do not have a sharps container, call your pharmacist or healthcare provider to get one.  A special MedGuide will be given to you by the pharmacist with each prescription and refill. Be sure to read this information carefully each time.  Talk to your pediatrician regarding the use of this medicine in children. While this drug may be prescribed for children as young as 4 years of age for selected conditions, precautions do apply.  Overdosage: If you think you have taken too much of this medicine contact a poison control center or emergency room at once.  NOTE:  This medicine is only for you. Do not share this medicine with others.  What if I miss a dose?  If you miss a dose, contact your health care professional to find out when you should take your next dose. Do not take double or extra doses without advice.  What may interact with this medicine?  Do not take this medicine with any of the following medications:  -anakinra  This medicine may also interact with the following medications:  -cyclophosphamide  -sulfasalazine  -vaccines  This list may not describe all possible interactions. Give your health care provider a list of all the medicines, herbs, non-prescription drugs, or dietary supplements you use. Also tell them if you smoke, drink alcohol, or use illegal drugs. Some items may interact with your medicine.  What should I watch for while using this medicine?  Tell your doctor or healthcare professional if your symptoms do not start to get better or if they get worse.  You will be tested for tuberculosis (TB) before you start this medicine. If your doctor prescribes any medicine for TB, you should start taking the TB medicine before starting this medicine. Make sure to finish the full course of TB medicine.  Call your doctor or health care professional for advice if you get a fever, chills or sore throat, or other symptoms of a cold or flu. Do not treat yourself. This drug decreases your body's ability to fight infections. Try to avoid being around people who are sick.  What side effects may I   notice from receiving this medicine?  Side effects that you should report to your doctor or health care professional as soon as possible:  -allergic reactions like skin rash, itching or hives, swelling of the face, lips, or tongue  -changes in vision  -fever, chills or any other sign of infection  -numbness or tingling in legs or other parts of the body  -red, scaly patches or raised bumps on the skin  -shortness of breath or difficulty breathing  -swollen lymph nodes in the  neck, underarm, or groin areas  -unexplained weight loss  -unusual bleeding or bruising  -unusual swelling or fluid retention in the legs  -unusually weak or tired  Side effects that usually do not require medical attention (report to your doctor or health care professional if they continue or are bothersome):  -dizziness  -headache  -nausea  -redness, itching, or swelling at the injection site  -vomiting  This list may not describe all possible side effects. Call your doctor for medical advice about side effects. You may report side effects to FDA at 1-800-FDA-1088.  Where should I keep my medicine?  Keep out of the reach of children.  Store between 2 and 8 degrees C (36 and 46 degrees F). Do not freeze or shake. Protect from light. Throw away any unused medicine after the expiration date.  You will be instructed on how to store this medicine.  NOTE: This sheet is a summary. It may not cover all possible information. If you have questions about this medicine, talk to your doctor, pharmacist, or health care provider.   2019 Elsevier/Gold Standard (2011-08-30 15:33:36)

## 2018-05-08 LAB — URINALYSIS, ROUTINE W REFLEX MICROSCOPIC
Bilirubin Urine: NEGATIVE
Glucose, UA: NEGATIVE
Hgb urine dipstick: NEGATIVE
Ketones, ur: NEGATIVE
LEUKOCYTE UA: NEGATIVE
Nitrite: NEGATIVE
Protein, ur: NEGATIVE
Specific Gravity, Urine: 1.014 (ref 1.001–1.03)
pH: 6.5 (ref 5.0–8.0)

## 2018-05-08 LAB — HIV ANTIBODY (ROUTINE TESTING W REFLEX): HIV 1&2 Ab, 4th Generation: NONREACTIVE

## 2018-05-08 LAB — PROTEIN ELECTROPHORESIS, SERUM, WITH REFLEX
Albumin ELP: 4.4 g/dL (ref 3.8–4.8)
Alpha 1: 0.3 g/dL (ref 0.2–0.3)
Alpha 2: 0.7 g/dL (ref 0.5–0.9)
Beta 2: 0.5 g/dL (ref 0.2–0.5)
Beta Globulin: 0.4 g/dL (ref 0.4–0.6)
Gamma Globulin: 1.1 g/dL (ref 0.8–1.7)
Total Protein: 7.5 g/dL (ref 6.1–8.1)

## 2018-05-08 LAB — CYCLIC CITRUL PEPTIDE ANTIBODY, IGG: Cyclic Citrullin Peptide Ab: <16 UNITS

## 2018-05-08 LAB — HLA-B27 ANTIGEN: HLA-B27 Antigen: POSITIVE — AB

## 2018-05-08 LAB — IGG, IGA, IGM
IgG (Immunoglobin G), Serum: 1167 mg/dL (ref 600–1640)
IgM, Serum: 114 mg/dL (ref 50–300)
Immunoglobulin A: 418 mg/dL — ABNORMAL HIGH (ref 47–310)

## 2018-05-08 LAB — HEPATITIS B SURFACE ANTIGEN: HEP B S AG: NONREACTIVE

## 2018-05-08 LAB — HEPATITIS C ANTIBODY
Hepatitis C Ab: NONREACTIVE
SIGNAL TO CUT-OFF: 0.01 (ref <1.00)

## 2018-05-08 LAB — GLUCOSE 6 PHOSPHATE DEHYDROGENASE: G-6PDH: 13.2 U/g Hgb (ref 7.0–20.5)

## 2018-05-08 LAB — RHEUMATOID FACTOR: Rheumatoid fact SerPl-aCnc: <14 IU/mL (ref <14)

## 2018-05-08 LAB — QUANTIFERON-TB GOLD PLUS
NIL: 0.03 IU/mL
QuantiFERON-TB Gold Plus: NEGATIVE
TB1-NIL: 0 [IU]/mL
TB2-NIL: 0.01 IU/mL

## 2018-05-08 LAB — URIC ACID: Uric Acid, Serum: 4.5 mg/dL (ref 4.0–8.0)

## 2018-05-08 LAB — HEPATITIS B CORE ANTIBODY, IGM: Hep B C IgM: NONREACTIVE

## 2018-05-08 LAB — CK: CK TOTAL: 113 U/L (ref 44–196)

## 2018-05-08 LAB — TSH: TSH: 2.24 mIU/L (ref 0.40–4.50)

## 2018-05-08 NOTE — Telephone Encounter (Signed)
Received a fax from [facility] regarding a prior authorization for ENBREL. Authorization has been APPROVED from 05/04/2018 to 03/07/2038.   Will send document to scan center.  Authorization # [id number] Phone # [phone]  Ran test claim, 1 month supply is $162.45. 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 Patient can fill through [facility]

## 2018-05-17 DIAGNOSIS — F329 Major depressive disorder, single episode, unspecified: Secondary | ICD-10-CM | POA: Insufficient documentation

## 2018-05-17 DIAGNOSIS — Z1589 Genetic susceptibility to other disease: Secondary | ICD-10-CM | POA: Insufficient documentation

## 2018-05-17 DIAGNOSIS — F419 Anxiety disorder, unspecified: Secondary | ICD-10-CM

## 2018-05-17 DIAGNOSIS — M461 Sacroiliitis, not elsewhere classified: Secondary | ICD-10-CM | POA: Insufficient documentation

## 2018-05-17 DIAGNOSIS — F1010 Alcohol abuse, uncomplicated: Secondary | ICD-10-CM | POA: Insufficient documentation

## 2018-05-17 DIAGNOSIS — M722 Plantar fascial fibromatosis: Secondary | ICD-10-CM | POA: Insufficient documentation

## 2018-05-17 DIAGNOSIS — M455 Ankylosing spondylitis of thoracolumbar region: Secondary | ICD-10-CM | POA: Insufficient documentation

## 2018-05-17 NOTE — Progress Notes (Signed)
Office Visit Note  Patient: Carl Lewis             Date of Birth: 1995/07/27           MRN: [id number]             PCP: Patient, No Pcp Per Referring: No ref. provider found Visit Date: 05/31/2018 Occupation: _0 @  Subjective:  Lower back pain  History of Present Illness: Carl Lewis is a 24 y.o. male with history of ankylosing spondylitis.  Patient states that he has been having some discomfort in the lower back and his feet.  The pain is tolerable currently.  He states he has some morning stiffness.  His last flare was in December which was severe and he could not get up from the bed.  He had good response to the cortisone injection.  He was accompanied by his mother today.  She is concerned that he has intermittent severe flares and would like for him to start on Enbrel as soon as possible.  After patient and his mother had detailed discussion regarding starting medication at this time or to wait after coronavirus pandemic is over they decided to proceed with the Enbrel injection today.  Activities of Daily Living:  Patient reports morning stiffness for5  minutes.   Patient Denies nocturnal pain.  Difficulty dressing/grooming: Denies Difficulty climbing stairs: Denies Difficulty getting out of chair: Denies Difficulty using hands for taps, buttons, cutlery, and/or writing: Denies  Review of Systems  Constitutional: Negative for fatigue and night sweats.  HENT: Negative for mouth sores, mouth dryness and nose dryness.   Eyes: Negative for redness and dryness.  Respiratory: Negative for shortness of breath and difficulty breathing.   Cardiovascular: Negative for chest pain, palpitations, hypertension, irregular heartbeat and swelling in legs/feet.  Gastrointestinal: Negative for constipation and diarrhea.  Endocrine: Negative for increased urination.  Musculoskeletal: Positive for arthralgias, joint pain and morning stiffness. Negative for joint swelling, myalgias, muscle  weakness, muscle tenderness and myalgias.  Skin: Negative for color change, rash, hair loss, nodules/bumps, skin tightness, ulcers and sensitivity to sunlight.  Allergic/Immunologic: Negative for susceptible to infections.  Neurological: Negative for dizziness, fainting, memory loss, night sweats and weakness ( ).  Hematological: Negative for swollen glands.  Psychiatric/Behavioral: Positive for depressed mood. Negative for sleep disturbance. The patient is nervous/anxious.     PMFS History:  Patient Active Problem List   Diagnosis Date Noted  . Ankylosing spondylitis of thoracolumbar region ([place]) 05/17/2018  . HLA B27 positive 05/17/2018  . Plantar fasciitis, bilateral 05/17/2018  . Sacroiliitis ([place]) 05/17/2018  . Anxiety and depression 05/17/2018  . Alcohol abuse 05/17/2018  . Pain of upper abdomen 03/16/2018  . Right flank pain, chronic 03/16/2018  . Food intolerance in adult 03/16/2018    Past Medical History:  Diagnosis Date  . Ankylosing spondylitis ([place])   . Anxiety and depression   . Sacroiliitis ([place])     Family History  Problem Relation Age of Onset  . Asthma Mother   . Anxiety disorder Mother   . Arthritis Father   . Healthy Sister   . Colon cancer Neg Hx   . Esophageal cancer Neg Hx   . Inflammatory bowel disease Neg Hx   . Liver disease Neg Hx   . Pancreatic cancer Neg Hx   . Rectal cancer Neg Hx   . Stomach cancer Neg Hx    Past Surgical History:  Procedure Laterality Date  . NO PAST SURGERIES  Social History   Social History Narrative  . Not on file    There is no immunization history on file for this patient.   Objective: Vital Signs: BP 124/78   Pulse 82   Resp 13   Ht 5' 10.5" (1.791 m)   Wt 152 lb 3.2 oz (69 kg)   BMI 21.53 kg/m    Physical Exam Vitals signs and nursing note reviewed.  Constitutional:      Appearance: He is well-developed.  HENT:     Head: Normocephalic and atraumatic.  Eyes:     Conjunctiva/sclera:  Conjunctivae normal.     Pupils: Pupils are equal, round, and reactive to light.  Neck:     Musculoskeletal: Normal range of motion and neck supple.  Cardiovascular:     Rate and Rhythm: Normal rate and regular rhythm.     Heart sounds: Normal heart sounds.  Pulmonary:     Effort: Pulmonary effort is normal.     Breath sounds: Normal breath sounds.  Abdominal:     General: Bowel sounds are normal.     Palpations: Abdomen is soft.  Lymphadenopathy:     Cervical: No cervical adenopathy.  Skin:    General: Skin is warm and dry.     Capillary Refill: Capillary refill takes less than 2 seconds.  Neurological:     Mental Status: He is alert and oriented to person, place, and time.  Psychiatric:        Behavior: Behavior normal.      Musculoskeletal Exam: C-spine thoracic spine and lumbar spine were in good range of motion.  He has discomfort on palpation over bilateral SI joints.  Shoulder joints, elbow joints, wrist joints, MCPs PIPs and DIPs been good range of motion with no synovitis.  Hip joints, knee joints, ankles, MTPs.  PIPs with good range of motion with no synovitis.  Mild tenderness over bilateral plantar fascia.  CDAI Exam: CDAI Score: Not documented Patient Global Assessment: Not documented; Provider Global Assessment: Not documented Swollen: Not documented; Tender: Not documented Joint Exam   Not documented   There is currently no information documented on the homunculus. Go to the Rheumatology activity and complete the homunculus joint exam.  Investigation: No additional findings.  Imaging: Xr Foot 2 Views Left  Result Date: 05/04/2018 No MTP PIP/DIP narrowing was noted.  No erosive changes were noted.  No intertarsal or tibiotalar joint space narrowing was noted. Impression: Unremarkable x-ray of the foot.  Xr Foot 2 Views Right  Result Date: 05/04/2018 No MTP PIP/DIP narrowing was noted.  No erosive changes were noted.  No intertarsal or tibiotalar joint  space narrowing was noted. Impression: Unremarkable x-ray of the foot.  Xr Pelvis 1-2 Views  Result Date: 05/04/2018 Bilateral severe SI joint to sclerosis with erosive changes was noted. Impression: These findings are consistent with severe bilateral sacroiliitis   Recent Labs: Lab Results  Component Value Date   WBC 7.2 03/10/2018   HGB 14.6 03/10/2018   PLT 237.0 03/10/2018   NA 139 03/10/2018   K 3.9 03/10/2018   CL 103 03/10/2018   CO2 27 03/10/2018   GLUCOSE 81 03/10/2018   BUN 12 03/10/2018   CREATININE 0.87 03/10/2018   BILITOT 0.4 03/10/2018   ALKPHOS 56 03/10/2018   AST 19 03/10/2018   ALT 9 03/10/2018   PROT 7.5 05/04/2018   ALBUMIN 4.6 03/10/2018   CALCIUM 9.8 03/10/2018   GFRAA >60 07/09/2017   QFTBGOLDPLUS NEGATIVE 05/04/2018  UA negative, SPEP  normal, immunoglobulins normal, TB Gold negative, HIV negative, hepatitis B-, hepatitis C negative, G6PD normal, TSH normal, CK 113, RF negative, anti-CCP negative, uric acid 4.5, HLA-B27 positive  Speciality Comments: No specialty comments available.  Procedures:  No procedures performed Allergies: Patient has no known allergies.   Assessment / Plan:     Visit Diagnoses: Ankylosing spondylitis of sacral region [facility]) - Diagnosed at [facility] rheumatology in the past and was discharged due to noncompliance.  MRI positive for sacroiliitis.  I detailed discussion with the patient today about Enbrel.  Enbrel has been approved.  Patient was given his first Enbrel injection in the office today.  Side effects were again reviewed.  He was observed in the office for 30 minutes.  He tolerated the injection well.  A prescription for Enbrel was sent.  He will start injecting himself once a week.  We will check labs in a month and then every 3 months to monitor for drug toxicity.  High risk medication use - - Plan: CBC with Differential/Platelet, COMPLETE METABOLIC PANEL WITH GFR in 1 month and then every 3 months.  HLA B27  positive  Plantar fasciitis, bilateral-patient has recurrent flares.  Sacroiliitis ([facility])-he had cortisone injections in December 2019 for lower back pain.  His SI joints have been doing better since then.  He has been also taking ibuprofen for pain management.  I have advised him to hold off ibuprofen during the time of this pandemic.  He should use Tylenol instead.  Anxiety and depression-he is under treatment.  Alcohol abuse   Orders: Orders Placed This Encounter  Procedures  . CBC with Differential/Platelet  . COMPLETE METABOLIC PANEL WITH GFR   Meds ordered this encounter  Medications  . Etanercept (ENBREL MINI) 50 MG/ML SOCT    Sig: Inject 50 mg into the skin once a week.    Dispense:  12 Cartridge    Refill:  0  . Etanercept SOCT 50 mg    Face-to-face time spent with patient was 30 minutes. Greater than 50% of time was spent in counseling and coordination of care.  Follow-Up Instructions: Return in about 3 months (around 08/31/2018) for Ankylosing Spondylitis.   Bo Merino, MD  Note - This record has been created using Editor, commissioning.  Chart creation errors have been sought, but may not always  have been located. Such creation errors do not reflect on  the standard of medical care.

## 2018-05-19 ENCOUNTER — Inpatient Hospital Stay: Admission: RE | Admit: 2018-05-19 | Payer: BLUE CROSS/BLUE SHIELD | Source: Ambulatory Visit

## 2018-05-31 ENCOUNTER — Other Ambulatory Visit: Payer: Self-pay

## 2018-05-31 ENCOUNTER — Ambulatory Visit (INDEPENDENT_AMBULATORY_CARE_PROVIDER_SITE_OTHER): Payer: BLUE CROSS/BLUE SHIELD | Admitting: Rheumatology

## 2018-05-31 ENCOUNTER — Encounter: Payer: Self-pay | Admitting: Rheumatology

## 2018-05-31 VITALS — BP 124/78 | HR 82 | Resp 13 | Ht 70.5 in | Wt 152.2 lb

## 2018-05-31 DIAGNOSIS — F419 Anxiety disorder, unspecified: Secondary | ICD-10-CM

## 2018-05-31 DIAGNOSIS — Z1589 Genetic susceptibility to other disease: Secondary | ICD-10-CM

## 2018-05-31 DIAGNOSIS — M461 Sacroiliitis, not elsewhere classified: Secondary | ICD-10-CM

## 2018-05-31 DIAGNOSIS — M722 Plantar fascial fibromatosis: Secondary | ICD-10-CM | POA: Diagnosis not present

## 2018-05-31 DIAGNOSIS — M458 Ankylosing spondylitis sacral and sacrococcygeal region: Secondary | ICD-10-CM | POA: Diagnosis not present

## 2018-05-31 DIAGNOSIS — F1010 Alcohol abuse, uncomplicated: Secondary | ICD-10-CM

## 2018-05-31 DIAGNOSIS — Z79899 Other long term (current) drug therapy: Secondary | ICD-10-CM

## 2018-05-31 DIAGNOSIS — F329 Major depressive disorder, single episode, unspecified: Secondary | ICD-10-CM

## 2018-05-31 MED ORDER — ETANERCEPT 50 MG/ML ~~LOC~~ SOCT: 50.0000 mg | SUBCUTANEOUS | 0 refills | Status: DC

## 2018-05-31 MED ORDER — ETANERCEPT 50 MG/ML ~~LOC~~ SOCT
50.0000 mg | Freq: Once | SUBCUTANEOUS | Status: AC
  Administered 2018-05-31: 50 mg via SUBCUTANEOUS

## 2018-05-31 NOTE — Progress Notes (Signed)
Patient started on Enbrel. Patient was given demonstration for proper technique to administer medication. Patient was able to show proper technique. Patient gave injection in left thigh. Patient was monitored in office for 30 minutes for adverse reactions. No advrse reactions noted.   Administrations This Visit    Etanercept SOCT 50 mg    Admin Date 05/31/2018 Action Given Dose 50 mg Route Subcutaneous Administered By Henriette Combs, LPN

## 2018-05-31 NOTE — Patient Instructions (Signed)
Please make sure you are up-to-date on your immunization.   Standing Labs We placed an order today for your standing lab work.    Please come back and get your standing labs in 1 month after starting Enbrel and then every 3 months.  We have open lab Monday through Friday from 8:30-11:30 AM and 1:30-4:00 PM  at the office of Dr. Pollyann Savoy.   You may experience shorter wait times on Monday and Friday afternoons. The office is located at [address], [address], [place], [place] [address] No appointment is necessary.   Labs are drawn by [facility].  You may receive a bill from [facility] for your lab work.  If you wish to have your labs drawn at another location, please call the office 24 hours in advance to send orders.  If you have any questions regarding directions or hours of operation,  please call [phone].   Just as a reminder please drink plenty of water prior to coming for your lab work. Thanks!

## 2018-06-01 ENCOUNTER — Ambulatory Visit: Payer: BLUE CROSS/BLUE SHIELD | Admitting: Rheumatology

## 2018-06-05 ENCOUNTER — Ambulatory Visit (INDEPENDENT_AMBULATORY_CARE_PROVIDER_SITE_OTHER)
Admission: RE | Admit: 2018-06-05 | Discharge: 2018-06-05 | Disposition: A | Discharge status: Home / Self Care | Payer: BLUE CROSS/BLUE SHIELD | Source: Ambulatory Visit | Attending: Gastroenterology | Admitting: Gastroenterology

## 2018-06-05 ENCOUNTER — Other Ambulatory Visit: Payer: Self-pay

## 2018-06-05 DIAGNOSIS — R1010 Upper abdominal pain, unspecified: Secondary | ICD-10-CM | POA: Diagnosis not present

## 2018-06-05 MED ORDER — IOHEXOL 300 MG/ML  SOLN
100.0000 mL | Freq: Once | INTRAMUSCULAR | Status: AC | PRN
  Administered 2018-06-05: 100 mL via INTRAVENOUS

## 2018-06-12 MED FILL — ENBREL MINI 50 MG/ML SOCT: 50 | 28 days supply | Qty: 4 | Fill #0

## 2018-06-20 ENCOUNTER — Other Ambulatory Visit: Payer: BLUE CROSS/BLUE SHIELD

## 2018-07-06 MED FILL — ENBREL MINI 50 MG/ML SOCT: 50 | 28 days supply | Qty: 4 | Fill #1

## 2018-08-01 MED FILL — ENBREL MINI 50 MG/ML SOCT: 50 | 28 days supply | Qty: 4 | Fill #2

## 2018-08-24 ENCOUNTER — Other Ambulatory Visit: Payer: Self-pay | Admitting: Rheumatology

## 2018-08-24 NOTE — Telephone Encounter (Signed)
Last Visit: 05/31/18 Next Visit 09/27/18 Labs: 03/10/18 CBC/CMP WNL TB Gold: 05/04/18 Neg   Patient states he will come to update labs today.   Okay to refill 30 day supply Enbrel?

## 2018-08-24 NOTE — Telephone Encounter (Signed)
Ok to refill after the lab draw.

## 2018-08-25 ENCOUNTER — Other Ambulatory Visit: Payer: Self-pay

## 2018-08-25 DIAGNOSIS — Z79899 Other long term (current) drug therapy: Secondary | ICD-10-CM

## 2018-08-26 LAB — CBC WITH DIFFERENTIAL/PLATELET
Absolute Monocytes: 473 cells/uL (ref 200–950)
Basophils Absolute: 62 cells/uL (ref 0–200)
Basophils Relative: 1.2 %
Eosinophils Absolute: 203 cells/uL (ref 15–500)
Eosinophils Relative: 3.9 %
HCT: 43.4 % (ref 38.5–50.0)
Hemoglobin: 15.4 g/dL (ref 13.2–17.1)
Lymphs Abs: 2257 cells/uL (ref 850–3900)
MCH: 33.3 pg — ABNORMAL HIGH (ref 27.0–33.0)
MCHC: 35.5 g/dL (ref 32.0–36.0)
MCV: 93.9 fL (ref 80.0–100.0)
MPV: 10.7 fL (ref 7.5–12.5)
Monocytes Relative: 9.1 %
Neutro Abs: 2205 cells/uL (ref 1500–7800)
Neutrophils Relative %: 42.4 %
Platelets: 211 10*3/uL (ref 140–400)
RBC: 4.62 10*6/uL (ref 4.20–5.80)
RDW: 12.7 % (ref 11.0–15.0)
Total Lymphocyte: 43.4 %
WBC: 5.2 10*3/uL (ref 3.8–10.8)

## 2018-08-26 LAB — COMPLETE METABOLIC PANEL WITH GFR
AG Ratio: 1.8 (calc) (ref 1.0–2.5)
ALT: 12 U/L (ref 9–46)
AST: 20 U/L (ref 10–40)
Albumin: 4.9 g/dL (ref 3.6–5.1)
Alkaline phosphatase (APISO): 65 U/L (ref 36–130)
BUN: 18 mg/dL (ref 7–25)
CO2: 22 mmol/L (ref 20–32)
Calcium: 9.8 mg/dL (ref 8.6–10.3)
Chloride: 105 mmol/L (ref 98–110)
Creat: 0.98 mg/dL (ref 0.60–1.35)
GFR, Est African American: 125 mL/min/{1.73_m2} (ref >=60)
GFR, Est Non African American: 108 mL/min/{1.73_m2} (ref >=60)
Globulin: 2.7 g/dL (calc) (ref 1.9–3.7)
Glucose, Bld: 105 mg/dL — ABNORMAL HIGH (ref 65–99)
Potassium: 4.1 mmol/L (ref 3.5–5.3)
Sodium: 137 mmol/L (ref 135–146)
Total Bilirubin: 0.7 mg/dL (ref 0.2–1.2)
Total Protein: 7.6 g/dL (ref 6.1–8.1)

## 2018-08-28 ENCOUNTER — Telehealth: Payer: Self-pay | Admitting: *Deleted

## 2018-08-28 MED ORDER — ENBREL MINI 50 MG/ML ~~LOC~~ SOCT: 50.0000 mg | SUBCUTANEOUS | 0 refills | Status: DC

## 2018-08-28 NOTE — Telephone Encounter (Signed)
Last Visit: 05/31/18 Next Visit 09/27/18 Labs: 08/25/18 CBC/CMP WNL TB Gold: 05/04/18 Neg   Okay to refill Enbrel per Dr. Estanislado Pandy

## 2018-08-28 NOTE — Telephone Encounter (Signed)
-----   Message from Ofilia Neas, PA-C sent at 08/28/2018  8:06 AM EDT ----- Labs are WNL

## 2018-08-28 NOTE — Progress Notes (Signed)
Labs are WNL.

## 2018-09-04 MED FILL — ENBREL MINI 50 MG/ML SOCT: 50 | 28 days supply | Qty: 4 | Fill #0

## 2018-09-13 NOTE — Progress Notes (Deleted)
Office Visit Note  Patient: Carl Lewis             Date of Birth: [birth year]           MRN: [id number]             PCP: Patient, No Pcp Per Referring: No ref. provider found Visit Date: 09/27/2018 Occupation: '@GUAROCC'$ @  Subjective:  No chief complaint on file.  Enbrel mini 50 mg every 7 days (started March 2020).  Last TB gold negative on 05/04/2018 and will monitor yearly.  Most recent CBC/CMP within normal limits on 08/25/2018.  Due for CBC/CMP in September and will monitor every 3 months.  Standing orders are in place.  Recommend annual flu, Pneumovax 23, and Prevnar 13 vaccines as indicated for immunosuppressive therapy.  Recommend yearly skin exams due to increased risk of melanoma.  History of Present Illness: Carl Lewis is a 23 y.o. male ***   Activities of Daily Living:  Patient reports morning stiffness for *** {minute/hour:19697}.   Patient {ACTIONS;DENIES/REPORTS:21021675::"Denies"} nocturnal pain.  Difficulty dressing/grooming: {ACTIONS;DENIES/REPORTS:21021675::"Denies"} Difficulty climbing stairs: {ACTIONS;DENIES/REPORTS:21021675::"Denies"} Difficulty getting out of chair: {ACTIONS;DENIES/REPORTS:21021675::"Denies"} Difficulty using hands for taps, buttons, cutlery, and/or writing: {ACTIONS;DENIES/REPORTS:21021675::"Denies"}  No Rheumatology ROS completed.   PMFS History:  Patient Active Problem List   Diagnosis Date Noted  . Ankylosing spondylitis of thoracolumbar region ([place]) 05/17/2018  . HLA B27 positive 05/17/2018  . Plantar fasciitis, bilateral 05/17/2018  . Sacroiliitis ([place]) 05/17/2018  . Anxiety and depression 05/17/2018  . Alcohol abuse 05/17/2018  . Pain of upper abdomen 03/16/2018  . Right flank pain, chronic 03/16/2018  . Food intolerance in adult 03/16/2018    Past Medical History:  Diagnosis Date  . Ankylosing spondylitis ([place])   . Anxiety and depression   . Sacroiliitis ([place])     Family History  Problem Relation Age of Onset  . Asthma  Mother   . Anxiety disorder Mother   . Arthritis Father   . Healthy Sister   . Colon cancer Neg Hx   . Esophageal cancer Neg Hx   . Inflammatory bowel disease Neg Hx   . Liver disease Neg Hx   . Pancreatic cancer Neg Hx   . Rectal cancer Neg Hx   . Stomach cancer Neg Hx    Past Surgical History:  Procedure Laterality Date  . NO PAST SURGERIES     Social History   Social History Narrative  . Not on file    There is no immunization history on file for this patient.   Objective: Vital Signs: There were no vitals taken for this visit.   Physical Exam   Musculoskeletal Exam: ***  CDAI Exam: CDAI Score: - Patient Global: -; Provider Global: - Swollen: -; Tender: - Joint Exam   No joint exam has been documented for this visit   There is currently no information documented on the homunculus. Go to the Rheumatology activity and complete the homunculus joint exam.  Investigation: No additional findings.  Imaging: No results found.  Recent Labs: Lab Results  Component Value Date   WBC 5.2 08/25/2018   HGB 15.4 08/25/2018   PLT 211 08/25/2018   NA 137 08/25/2018   K 4.1 08/25/2018   CL 105 08/25/2018   CO2 22 08/25/2018   GLUCOSE 105 (H) 08/25/2018   BUN 18 08/25/2018   CREATININE 0.98 08/25/2018   BILITOT 0.7 08/25/2018   ALKPHOS 56 03/10/2018   AST 20 08/25/2018   ALT 12 08/25/2018   PROT 7.6 08/25/2018  ALBUMIN 4.6 03/10/2018   CALCIUM 9.8 08/25/2018   GFRAA 125 08/25/2018   QFTBGOLDPLUS NEGATIVE 05/04/2018    Speciality Comments: No specialty comments available.  Procedures:  No procedures performed Allergies: Patient has no known allergies.   Assessment / Plan:     Visit Diagnoses: No diagnosis found.  Orders: No orders of the defined types were placed in this encounter.  No orders of the defined types were placed in this encounter.   Face-to-face time spent with patient was *** minutes. Greater than 50% of time was spent in counseling  and coordination of care.  Follow-Up Instructions: No follow-ups on file.   Ofilia Neas, PA-C  Note - This record has been created using Dragon software.  Chart creation errors have been sought, but may not always  have been located. Such creation errors do not reflect on  the standard of medical care.

## 2018-09-27 ENCOUNTER — Ambulatory Visit: Payer: BLUE CROSS/BLUE SHIELD | Admitting: Physician Assistant

## 2018-09-28 MED FILL — ENBREL MINI 50 MG/ML SOCT: 50 | 28 days supply | Qty: 4 | Fill #1

## 2018-10-23 MED FILL — LAMOTRIGINE 100 MG TABS: 100 | 30 days supply | Qty: 30 | Fill #0

## 2018-10-23 MED FILL — traZODone HCL 100 MG TABS: 100 | 30 days supply | Qty: 30 | Fill #0

## 2018-10-30 MED FILL — ENBREL MINI 50 MG/ML SOCT: 50 | 28 days supply | Qty: 4 | Fill #2

## 2018-11-27 ENCOUNTER — Other Ambulatory Visit: Payer: Self-pay | Admitting: Rheumatology

## 2018-11-27 MED FILL — ENBREL MINI 50 MG/ML SOCT: 50 | 28 days supply | Qty: 4 | Fill #0

## 2018-11-27 NOTE — Progress Notes (Signed)
Office Visit Note  Patient: Carl Lewis             Date of Birth: 02-07-1996           MRN: [id number]             PCP: Patient, No Pcp Per Referring: No ref. provider found Visit Date: 11/29/2018 Occupation: '@GUAROCC'$ @  Subjective:  Medication monitoring   History of Present Illness: Carl Lewis is a 23 y.o. male with history of ankylosing spondylitis.  He states he has been taking Enbrel on weekly basis.  He continues to have some stiffness in his neck.  He denies any sacral iliac joint pain.  He has had no recurrence of plantar fasciitis.  He has not noticed any side effects from Enbrel.  Activities of Daily Living:  Patient reports morning stiffness for 0 none.   Patient Denies nocturnal pain.  Difficulty dressing/grooming: Denies Difficulty climbing stairs: Denies Difficulty getting out of chair: Denies Difficulty using hands for taps, buttons, cutlery, and/or writing: Denies  Review of Systems  Constitutional: Negative for fatigue and night sweats.  HENT: Negative for mouth sores, mouth dryness and nose dryness.   Eyes: Negative for redness and dryness.  Respiratory: Negative for shortness of breath and difficulty breathing.   Cardiovascular: Negative for chest pain, palpitations, hypertension, irregular heartbeat and swelling in legs/feet.  Gastrointestinal: Negative for constipation and diarrhea.  Endocrine: Negative for increased urination.  Genitourinary: Negative for difficulty urinating.  Musculoskeletal: Negative for arthralgias, joint pain, joint swelling, myalgias, muscle weakness, morning stiffness, muscle tenderness and myalgias.  Skin: Negative for color change, rash, hair loss, nodules/bumps, skin tightness, ulcers and sensitivity to sunlight.  Allergic/Immunologic: Negative for susceptible to infections.  Neurological: Negative for dizziness, fainting, memory loss, night sweats and weakness.  Hematological: Negative for bruising/bleeding tendency and  swollen glands.  Psychiatric/Behavioral: Positive for sleep disturbance. Negative for depressed mood. The patient is nervous/anxious.     PMFS History:  Patient Active Problem List   Diagnosis Date Noted  . Ankylosing spondylitis of thoracolumbar region ([place]) 05/17/2018  . HLA B27 positive 05/17/2018  . Plantar fasciitis, bilateral 05/17/2018  . Sacroiliitis ([place]) 05/17/2018  . Anxiety and depression 05/17/2018  . Alcohol abuse 05/17/2018  . Pain of upper abdomen 03/16/2018  . Right flank pain, chronic 03/16/2018  . Food intolerance in adult 03/16/2018    Past Medical History:  Diagnosis Date  . Ankylosing spondylitis ([place])   . Anxiety and depression   . Sacroiliitis ([place])     Family History  Problem Relation Age of Onset  . Asthma Mother   . Anxiety disorder Mother   . Arthritis Father   . Healthy Sister   . Colon cancer Neg Hx   . Esophageal cancer Neg Hx   . Inflammatory bowel disease Neg Hx   . Liver disease Neg Hx   . Pancreatic cancer Neg Hx   . Rectal cancer Neg Hx   . Stomach cancer Neg Hx    Past Surgical History:  Procedure Laterality Date  . NO PAST SURGERIES     Social History   Social History Narrative  . Not on file    There is no immunization history on file for this patient.   Objective: Vital Signs: BP 119/70 (BP Location: Right Arm, Patient Position: Supine, Cuff Size: Normal)   Pulse 80   Resp 14   Ht '5\' 11"'$  (1.803 m)   Wt 157 lb 9.6 oz (71.5 kg)   BMI 21.98  kg/m    Physical Exam Vitals signs and nursing note reviewed.  Constitutional:      Appearance: He is well-developed.  HENT:     Head: Normocephalic and atraumatic.  Eyes:     Conjunctiva/sclera: Conjunctivae normal.     Pupils: Pupils are equal, round, and reactive to light.  Neck:     Musculoskeletal: Normal range of motion and neck supple.  Cardiovascular:     Rate and Rhythm: Normal rate and regular rhythm.     Heart sounds: Normal heart sounds.  Pulmonary:      Effort: Pulmonary effort is normal.     Breath sounds: Normal breath sounds.  Abdominal:     General: Bowel sounds are normal.     Palpations: Abdomen is soft.  Skin:    General: Skin is warm and dry.     Capillary Refill: Capillary refill takes less than 2 seconds.  Neurological:     Mental Status: He is alert and oriented to person, place, and time.  Psychiatric:        Behavior: Behavior normal.      Musculoskeletal Exam: C-spine was good range of motion.  Lumbar spine with good range of motion.  He is unable to reach his toes due to limited flexion in his lumbar spine and tight hamstrings.  No SI joint tenderness was noted.  Shoulder joints, elbow joints, wrist joints, MCPs PIPs DIPs with good range of motion with no synovitis.  Hip joints, knee joints, ankles, MTPs with good range of motion with no synovitis.  He had no plantar fasciitis. CDAI Exam: CDAI Score: - Patient Global: -; Provider Global: - Swollen: -; Tender: - Joint Exam   No joint exam has been documented for this visit   There is currently no information documented on the homunculus. Go to the Rheumatology activity and complete the homunculus joint exam.  Investigation: No additional findings.  Imaging: No results found.  Recent Labs: Lab Results  Component Value Date   WBC 5.2 08/25/2018   HGB 15.4 08/25/2018   PLT 211 08/25/2018   NA 137 08/25/2018   K 4.1 08/25/2018   CL 105 08/25/2018   CO2 22 08/25/2018   GLUCOSE 105 (H) 08/25/2018   BUN 18 08/25/2018   CREATININE 0.98 08/25/2018   BILITOT 0.7 08/25/2018   ALKPHOS 56 03/10/2018   AST 20 08/25/2018   ALT 12 08/25/2018   PROT 7.6 08/25/2018   ALBUMIN 4.6 03/10/2018   CALCIUM 9.8 08/25/2018   GFRAA 125 08/25/2018   QFTBGOLDPLUS NEGATIVE 05/04/2018    Speciality Comments: No specialty comments available.  Procedures:  No procedures performed Allergies: Patient has no known allergies.   Assessment / Plan:     Visit Diagnoses:  Ankylosing spondylitis of sacral region [facility]) - Diagnosed at [facility] rheumatology in the past and was discharged due to noncompliance.  MRI positive for sacroiliitis.  He has been doing well on Enbrel weekly.  He had no synovitis and no recurrence of plantar fasciitis.  No SI joint tenderness was noted.  Neck pain-he complains of some neck stiffness and tightness.  He has been to the chiropractor in the past.  Have given a handout on neck exercises.  High risk medication use - Enbrel Mini 50 mg every 7 days (started March 2020). Last TB gold negative on 05/04/2018 and will monitor yearly.  Most recent CBC/CMP within normal limits on 08/25/2018.  Due for CBC/cMP today and will monitor every 3 months.- Plan: CBC with Differential/Platelet,  COMPLETE METABOLIC PANEL WITH GFR.  Precautions during the pandemic with social distancing using a mask and good handwashing were emphasized.  HLA B27 positive  Plantar fasciitis, bilateral-no recent episodes.  Sacroiliitis ([place]) - he had cortisone injections in December 2019 for lower back pain.   Anxiety and depression-he still have some underlying anxiety and insomnia.  History of alcohol use-patient states he has not been drinking alcohol recently.  Orders: Orders Placed This Encounter  Procedures  . CBC with Differential/Platelet  . COMPLETE METABOLIC PANEL WITH GFR   No orders of the defined types were placed in this encounter.     Follow-Up Instructions: Return in about 5 months (around 05/01/2019) for Ankylosing spondylitis.   Bo Merino, MD  Note - This record has been created using Editor, commissioning.  Chart creation errors have been sought, but may not always  have been located. Such creation errors do not reflect on  the standard of medical care.

## 2018-11-27 NOTE — Telephone Encounter (Signed)
Last Visit: 05/31/18 Next Visit: 11/29/18 Labs: 08/25/18 WNL TB Gold: 05/04/18 Neg   Okay to refill 30 day supply per Dr. Estanislado Pandy

## 2018-11-29 ENCOUNTER — Ambulatory Visit (INDEPENDENT_AMBULATORY_CARE_PROVIDER_SITE_OTHER): Payer: Managed Care, Other (non HMO) | Admitting: Rheumatology

## 2018-11-29 ENCOUNTER — Encounter: Payer: Self-pay | Admitting: Rheumatology

## 2018-11-29 ENCOUNTER — Other Ambulatory Visit: Payer: Self-pay

## 2018-11-29 VITALS — BP 119/70 | HR 80 | Resp 14 | Ht 71.0 in | Wt 157.6 lb

## 2018-11-29 DIAGNOSIS — M461 Sacroiliitis, not elsewhere classified: Secondary | ICD-10-CM

## 2018-11-29 DIAGNOSIS — M542 Cervicalgia: Secondary | ICD-10-CM

## 2018-11-29 DIAGNOSIS — Z79899 Other long term (current) drug therapy: Secondary | ICD-10-CM

## 2018-11-29 DIAGNOSIS — Z1589 Genetic susceptibility to other disease: Secondary | ICD-10-CM | POA: Diagnosis not present

## 2018-11-29 DIAGNOSIS — M722 Plantar fascial fibromatosis: Secondary | ICD-10-CM

## 2018-11-29 DIAGNOSIS — Z87898 Personal history of other specified conditions: Secondary | ICD-10-CM

## 2018-11-29 DIAGNOSIS — F419 Anxiety disorder, unspecified: Secondary | ICD-10-CM

## 2018-11-29 DIAGNOSIS — F329 Major depressive disorder, single episode, unspecified: Secondary | ICD-10-CM

## 2018-11-29 DIAGNOSIS — F32A Depression, unspecified: Secondary | ICD-10-CM

## 2018-11-29 DIAGNOSIS — M458 Ankylosing spondylitis sacral and sacrococcygeal region: Secondary | ICD-10-CM

## 2018-11-29 LAB — CBC WITH DIFFERENTIAL/PLATELET
Absolute Monocytes: 592 cells/uL (ref 200–950)
Basophils Absolute: 70 cells/uL (ref 0–200)
Basophils Relative: 1.2 %
Eosinophils Absolute: 162 cells/uL (ref 15–500)
Eosinophils Relative: 2.8 %
HCT: 44.7 % (ref 38.5–50.0)
Hemoglobin: 15.5 g/dL (ref 13.2–17.1)
Lymphs Abs: 1955 cells/uL (ref 850–3900)
MCH: 33.5 pg — ABNORMAL HIGH (ref 27.0–33.0)
MCHC: 34.7 g/dL (ref 32.0–36.0)
MCV: 96.5 fL (ref 80.0–100.0)
MPV: 10.7 fL (ref 7.5–12.5)
Monocytes Relative: 10.2 %
Neutro Abs: 3022 cells/uL (ref 1500–7800)
Neutrophils Relative %: 52.1 %
Platelets: 220 10*3/uL (ref 140–400)
RBC: 4.63 10*6/uL (ref 4.20–5.80)
RDW: 12.2 % (ref 11.0–15.0)
Total Lymphocyte: 33.7 %
WBC: 5.8 10*3/uL (ref 3.8–10.8)

## 2018-11-29 LAB — COMPLETE METABOLIC PANEL WITH GFR
AG Ratio: 2 (calc) (ref 1.0–2.5)
ALT: 13 U/L (ref 9–46)
AST: 39 U/L (ref 10–40)
Albumin: 4.7 g/dL (ref 3.6–5.1)
Alkaline phosphatase (APISO): 57 U/L (ref 36–130)
BUN: 13 mg/dL (ref 7–25)
CO2: 28 mmol/L (ref 20–32)
Calcium: 10.1 mg/dL (ref 8.6–10.3)
Chloride: 103 mmol/L (ref 98–110)
Creat: 1 mg/dL (ref 0.60–1.35)
GFR, Est African American: 122 mL/min/{1.73_m2} (ref >=60)
GFR, Est Non African American: 106 mL/min/{1.73_m2} (ref >=60)
Globulin: 2.3 g/dL (calc) (ref 1.9–3.7)
Glucose, Bld: 68 mg/dL (ref 65–99)
Potassium: 4 mmol/L (ref 3.5–5.3)
Sodium: 139 mmol/L (ref 135–146)
Total Bilirubin: 0.8 mg/dL (ref 0.2–1.2)
Total Protein: 7 g/dL (ref 6.1–8.1)

## 2018-11-29 NOTE — Patient Instructions (Signed)
Cervical Strain and Sprain Rehab Ask your health care provider which exercises are safe for you. Do exercises exactly as told by your health care provider and adjust them as directed. It is normal to feel mild stretching, pulling, tightness, or discomfort as you do these exercises. Stop right away if you feel sudden pain or your pain gets worse. Do not begin these exercises until told by your health care provider. Stretching and range-of-motion exercises Cervical side bending  1. Using good posture, sit on a stable chair or stand up. 2. Without moving your shoulders, slowly tilt your left / right ear to your shoulder until you feel a stretch in the opposite side neck muscles. You should be looking straight ahead. 3. Hold for __________ seconds. 4. Repeat with the other side of your neck. Repeat __________ times. Complete this exercise __________ times a day. Cervical rotation  1. Using good posture, sit on a stable chair or stand up. 2. Slowly turn your head to the side as if you are looking over your left / right shoulder. ? Keep your eyes level with the ground. ? Stop when you feel a stretch along the side and the back of your neck. 3. Hold for __________ seconds. 4. Repeat this by turning to your other side. Repeat __________ times. Complete this exercise __________ times a day. Thoracic extension and pectoral stretch 1. Roll a towel or a small blanket so it is about 4 inches (10 cm) in diameter. 2. Lie down on your back on a firm surface. 3. Put the towel lengthwise, under your spine in the middle of your back. It should not be under your shoulder blades. The towel should line up with your spine from your middle back to your lower back. 4. Put your hands behind your head and let your elbows fall out to your sides. 5. Hold for __________ seconds. Repeat __________ times. Complete this exercise __________ times a day. Strengthening exercises Isometric upper cervical flexion 1. Lie on  your back with a thin pillow behind your head and a small rolled-up towel under your neck. 2. Gently tuck your chin toward your chest and nod your head down to look toward your feet. Do not lift your head off the pillow. 3. Hold for __________ seconds. 4. Release the tension slowly. Relax your neck muscles completely before you repeat this exercise. Repeat __________ times. Complete this exercise __________ times a day. Isometric cervical extension  1. Stand about 6 inches (15 cm) away from a wall, with your back facing the wall. 2. Place a soft object, about 6-8 inches (15-20 cm) in diameter, between the back of your head and the wall. A soft object could be a small pillow, a ball, or a folded towel. 3. Gently tilt your head back and press into the soft object. Keep your jaw and forehead relaxed. 4. Hold for __________ seconds. 5. Release the tension slowly. Relax your neck muscles completely before you repeat this exercise. Repeat __________ times. Complete this exercise __________ times a day. Posture and body mechanics Body mechanics refers to the movements and positions of your body while you do your daily activities. Posture is part of body mechanics. Good posture and healthy body mechanics can help to relieve stress in your body's tissues and joints. Good posture means that your spine is in its natural S-curve position (your spine is neutral), your shoulders are pulled back slightly, and your head is not tipped forward. The following are general guidelines for applying improved  posture and body mechanics to your everyday activities. Sitting  1. When sitting, keep your spine neutral and keep your feet flat on the floor. Use a footrest, if necessary, and keep your thighs parallel to the floor. Avoid rounding your shoulders, and avoid tilting your head forward. 2. When working at a desk or a computer, keep your desk at a height where your hands are slightly lower than your elbows. Slide your  chair under your desk so you are close enough to maintain good posture. 3. When working at a computer, place your monitor at a height where you are looking straight ahead and you do not have to tilt your head forward or downward to look at the screen. Standing   When standing, keep your spine neutral and keep your feet about hip-width apart. Keep a slight bend in your knees. Your ears, shoulders, and hips should line up.  When you do a task in which you stand in one place for a long time, place one foot up on a stable object that is 2-4 inches (5-10 cm) high, such as a footstool. This helps keep your spine neutral. Resting When lying down and resting, avoid positions that are most painful for you. Try to support your neck in a neutral position. You can use a contour pillow or a small rolled-up towel. Your pillow should support your neck but not push on it. This information is not intended to replace advice given to you by your health care provider. Make sure you discuss any questions you have with your health care provider. Document Released: 02/22/2005 Document Revised: 06/14/2018 Document Reviewed: 11/23/2017 Elsevier Patient Education  2020 [place] We placed an order today for your standing lab work.    Please come back and get your standing labs in December and every 3 months We have open lab daily Monday through Thursday from 8:30-12:30 PM and 1:30-4:30 PM and Friday from 8:30-12:30 PM and 1:30 -4:00 PM at the office of Dr. Bo Merino.   You may experience shorter wait times on Monday and Friday afternoons. The office is located at [address], [place], [place], [place] [address] No appointment is necessary.   Labs are drawn by [facility].  You may receive a bill from [facility] for your lab work.  If you wish to have your labs drawn at another location, please call the office 24 hours in advance to send orders.  If you have any questions regarding  directions or hours of operation,  please call [phone].   Just as a reminder please drink plenty of water prior to coming for your lab work. Thanks!

## 2018-12-19 ENCOUNTER — Other Ambulatory Visit: Payer: Self-pay | Admitting: Rheumatology

## 2018-12-19 NOTE — Telephone Encounter (Signed)
Last Visit: 11/29/18 Next visit: 05/01/18 Labs: 11/29/18 WNL TB Gold: 05/04/18 Neg   Okay to refill per Dr. Estanislado Pandy

## 2018-12-27 ENCOUNTER — Telehealth: Payer: Self-pay | Admitting: Pharmacy Technician

## 2018-12-27 DIAGNOSIS — M458 Ankylosing spondylitis sacral and sacrococcygeal region: Secondary | ICD-10-CM

## 2018-12-27 MED ORDER — ENBREL MINI 50 MG/ML ~~LOC~~ SOCT: 50.0000 mg | SUBCUTANEOUS | 0 refills | Status: DC

## 2018-12-27 NOTE — Telephone Encounter (Signed)
New prescription sent to [facility].   Mariella Saa, PharmD, [place], [place] Clinical Specialty Pharmacist [phone]  12/27/2018 8:30 AM

## 2018-12-27 NOTE — Telephone Encounter (Signed)
Received e-mail from [facility] that patient has new insurance and needs a new prior authorization.  Submitted Expedited Prior Authorization request to [place] for ENBREL via Cover My Meds. Will update once we receive a response.   *Patient has new insurance plan- [employer]. Per plan, patient will have to use [facility].  8:24 AM Beatriz Chancellor, CPhT

## 2018-12-27 NOTE — Telephone Encounter (Signed)
Left patient a message to advise of new prior authorization for his new insurance. And advised that he would be required to use a new specialty pharmacy. Will follow up once we receive insurance response.

## 2018-12-29 NOTE — Telephone Encounter (Signed)
Received notification from [place] regarding a prior authorization for ENBREL. Authorization has been APPROVED from 12/28/18 to 12/28/19.   Will send document to scan center.  Authorization # [id number]  Called [facility], and provided Prior Auth info. Also, called patient to advise- left message.  9:29 AM Beatriz Chancellor, CPhT

## 2019-01-03 NOTE — Telephone Encounter (Signed)
Patient left message at [facility] inquiring about order status. Called [facility], patient's order is ready, they are just waiting for patient to call and confirm shipment.   Called patient, he advised to call back in 20 minutes.  [facility]# [phone]  1:28 PM Beatriz Chancellor, CPhT

## 2019-01-03 NOTE — Telephone Encounter (Signed)
Spoke to patient, he is now on the line with [facility].

## 2019-01-04 NOTE — Telephone Encounter (Signed)
Called [facility] to confirm patient got his shipment scheduled. Rep Margreta Journey advised that patient's shipment is scheduled for 11/9. I advised that that is too long due to patient already on medication and is due for his next dose. Rep said notes advised date was chosen due to waiting on prior authorization. Advised rep that I called on 12/29/18 to advised prior authorization was approved on 12/28/18. Rep states claim is still rejecting.  Called [place], Rep Roselyn Reef confirmed active Prior Authorization is on file for Enbrel, but pharmacy must run a 28 day supply not 84 days.  Called [facility], advised that they must run 28 day supply for Enbrel. And requested shipment to be expedited to ship ASAP.  Called patient, advised of above. Told patient he can come by the office to pick up samples. And to call [facility] to confirm an earlier shipment date.  9:41 AM Beatriz Chancellor, CPhT

## 2019-01-04 NOTE — Telephone Encounter (Signed)
Patient called to confirm he will stop by office for a sample. Provided patient with [facility] phone number again.  Also advised patient to provide Enbrel Copay card info to [facility] and to expedite shipment.  1:26 PM Beatriz Chancellor, CPhT

## 2019-03-20 ENCOUNTER — Other Ambulatory Visit: Payer: Self-pay | Admitting: Rheumatology

## 2019-03-20 DIAGNOSIS — Z79899 Other long term (current) drug therapy: Secondary | ICD-10-CM

## 2019-03-20 DIAGNOSIS — M458 Ankylosing spondylitis sacral and sacrococcygeal region: Secondary | ICD-10-CM

## 2019-03-20 NOTE — Telephone Encounter (Signed)
Last Visit: 11/29/18 Next visit: 05/01/18 Labs: 11/29/18 WNL TB Gold: 05/04/18 Neg   Patient advised he is due to update labs. Patient will update labs on 03/26/19.  Okay to refill 30 day supply per Dr. Corliss Skains

## 2019-03-27 LAB — CBC WITH DIFFERENTIAL/PLATELET
Absolute Monocytes: 372 cells/uL (ref 200–950)
Basophils Absolute: 61 cells/uL (ref 0–200)
Basophils Relative: 1.6 %
Eosinophils Absolute: 270 cells/uL (ref 15–500)
Eosinophils Relative: 7.1 %
HCT: 43.2 % (ref 38.5–50.0)
Hemoglobin: 15.1 g/dL (ref 13.2–17.1)
Lymphs Abs: 1786 cells/uL (ref 850–3900)
MCH: 33.4 pg — ABNORMAL HIGH (ref 27.0–33.0)
MCHC: 35 g/dL (ref 32.0–36.0)
MCV: 95.6 fL (ref 80.0–100.0)
MPV: 11 fL (ref 7.5–12.5)
Monocytes Relative: 9.8 %
Neutro Abs: 1311 cells/uL — ABNORMAL LOW (ref 1500–7800)
Neutrophils Relative %: 34.5 %
Platelets: 214 10*3/uL (ref 140–400)
RBC: 4.52 10*6/uL (ref 4.20–5.80)
RDW: 11.8 % (ref 11.0–15.0)
Total Lymphocyte: 47 %
WBC: 3.8 10*3/uL (ref 3.8–10.8)

## 2019-03-27 LAB — COMPLETE METABOLIC PANEL WITH GFR
AG Ratio: 2.2 (calc) (ref 1.0–2.5)
ALT: 11 U/L (ref 9–46)
AST: 21 U/L (ref 10–40)
Albumin: 4.8 g/dL (ref 3.6–5.1)
Alkaline phosphatase (APISO): 55 U/L (ref 36–130)
BUN: 16 mg/dL (ref 7–25)
CO2: 25 mmol/L (ref 20–32)
Calcium: 9.9 mg/dL (ref 8.6–10.3)
Chloride: 103 mmol/L (ref 98–110)
Creat: 1.04 mg/dL (ref 0.60–1.35)
GFR, Est African American: 116 mL/min/{1.73_m2} (ref >=60)
GFR, Est Non African American: 100 mL/min/{1.73_m2} (ref >=60)
Globulin: 2.2 g/dL (calc) (ref 1.9–3.7)
Glucose, Bld: 88 mg/dL (ref 65–139)
Potassium: 4 mmol/L (ref 3.5–5.3)
Sodium: 140 mmol/L (ref 135–146)
Total Bilirubin: 1 mg/dL (ref 0.2–1.2)
Total Protein: 7 g/dL (ref 6.1–8.1)

## 2019-03-28 NOTE — Telephone Encounter (Signed)
CBC stable. CMP WNL.

## 2019-04-25 NOTE — Progress Notes (Signed)
Office Visit Note  Patient: Carl Lewis             Date of Birth: [birth year]           MRN: [id number]             PCP: Patient, No Pcp Per Referring: No ref. provider found Visit Date: 05/02/2019 Occupation: '@GUAROCC'$ @  Subjective:  Medication monitoring   History of Present Illness: Carl Lewis is a 24 y.o. male with history of ankylosing spondylitis.  He is on enbrel 50 mg sq injections once weekly.  Patient reports that his last Enbrel injection was on Friday but prior to that he missed 2 doses of Enbrel due to needing a refill.  He would like a sample of Enbrel today.  He states he was experiencing increased arthralgias while off of Enbrel.  He was having intermittent left hip pain and pain in bilateral ankle joints.  He denies any joint swelling.  He denies any Achilles tendinitis or plantar fasciitis.  He denies any neck or lower back pain.  He has not had any SI joint pain recently.   Activities of Daily Living:  Patient reports morning stiffness for 0 minutes.   Patient Denies nocturnal pain.  Difficulty dressing/grooming: Denies Difficulty climbing stairs: Denies Difficulty getting out of chair: Denies Difficulty using hands for taps, buttons, cutlery, and/or writing: Denies  Review of Systems  Constitutional: Negative for fatigue and night sweats.  HENT: Negative for mouth sores, mouth dryness and nose dryness.   Eyes: Negative for redness, itching and dryness.  Respiratory: Negative for shortness of breath and difficulty breathing.   Cardiovascular: Positive for palpitations. Negative for chest pain, hypertension, irregular heartbeat and swelling in legs/feet.  Gastrointestinal: Negative for blood in stool, constipation and diarrhea.  Endocrine: Negative for increased urination.  Genitourinary: Negative for painful urination.  Musculoskeletal: Positive for arthralgias and joint pain. Negative for joint swelling, myalgias, muscle weakness, morning stiffness, muscle  tenderness and myalgias.  Skin: Negative for color change, rash, hair loss, nodules/bumps, skin tightness, ulcers and sensitivity to sunlight.  Allergic/Immunologic: Negative for susceptible to infections.  Neurological: Negative for dizziness, fainting, numbness, headaches, memory loss, night sweats and weakness.  Hematological: Negative for bruising/bleeding tendency and swollen glands.  Psychiatric/Behavioral: Negative for depressed mood, confusion and sleep disturbance. The patient is not nervous/anxious.     PMFS History:  Patient Active Problem List   Diagnosis Date Noted  . Ankylosing spondylitis of thoracolumbar region ([place]) 05/17/2018  . HLA B27 positive 05/17/2018  . Plantar fasciitis, bilateral 05/17/2018  . Sacroiliitis ([place]) 05/17/2018  . Anxiety and depression 05/17/2018  . Alcohol abuse 05/17/2018  . Pain of upper abdomen 03/16/2018  . Right flank pain, chronic 03/16/2018  . Food intolerance in adult 03/16/2018    Past Medical History:  Diagnosis Date  . Ankylosing spondylitis ([place])   . Anxiety and depression   . Sacroiliitis ([place])     Family History  Problem Relation Age of Onset  . Asthma Mother   . Anxiety disorder Mother   . Arthritis Father   . Healthy Sister   . Colon cancer Neg Hx   . Esophageal cancer Neg Hx   . Inflammatory bowel disease Neg Hx   . Liver disease Neg Hx   . Pancreatic cancer Neg Hx   . Rectal cancer Neg Hx   . Stomach cancer Neg Hx    Past Surgical History:  Procedure Laterality Date  . NO PAST SURGERIES  Social History   Social History Narrative  . Not on file    There is no immunization history on file for this patient.   Objective: Vital Signs: BP 113/67 (BP Location: Right Arm, Patient Position: Sitting, Cuff Size: Normal)   Pulse 87   Resp 14   Ht '5\' 10"'$  (1.778 m)   Wt 165 lb 3.2 oz (74.9 kg)   BMI 23.70 kg/m    Physical Exam Vitals and nursing note reviewed.  Constitutional:      Appearance: He is  well-developed.  HENT:     Head: Normocephalic and atraumatic.  Eyes:     Conjunctiva/sclera: Conjunctivae normal.     Pupils: Pupils are equal, round, and reactive to light.  Pulmonary:     Effort: Pulmonary effort is normal.  Abdominal:     General: Bowel sounds are normal.     Palpations: Abdomen is soft.  Musculoskeletal:     Cervical back: Normal range of motion and neck supple.  Skin:    General: Skin is warm and dry.     Capillary Refill: Capillary refill takes less than 2 seconds.  Neurological:     Mental Status: He is alert and oriented to person, place, and time.  Psychiatric:        Behavior: Behavior normal.      Musculoskeletal Exam: C-spine good range of motion.  Slightly limited flexion of the lumbar spine.  No midline spinal tenderness.  No SI joint tenderness.  Shoulder joints, elbow joints, wrist joints, MCPs and PIPs and DIPs good range of motion with no synovitis.  He has complete fist formation bilaterally.  Hip joints have good range of motion with no discomfort.  Knee joints, ankle joints, MTPs, PIPs and DIPs good range of motion with no synovitis.  No warmth or effusion of bilateral knee joints.  No tenderness or swelling of ankle joints.  No achilles tendonitis or plantar fasciitis.   CDAI Exam: CDAI Score: -- Patient Global: --; Provider Global: -- Swollen: --; Tender: -- Joint Exam 05/02/2019   No joint exam has been documented for this visit   There is currently no information documented on the homunculus. Go to the Rheumatology activity and complete the homunculus joint exam.  Investigation: No additional findings.  Imaging: No results found.  Recent Labs: Lab Results  Component Value Date   WBC 3.8 03/27/2019   HGB 15.1 03/27/2019   PLT 214 03/27/2019   NA 140 03/27/2019   K 4.0 03/27/2019   CL 103 03/27/2019   CO2 25 03/27/2019   GLUCOSE 88 03/27/2019   BUN 16 03/27/2019   CREATININE 1.04 03/27/2019   BILITOT 1.0 03/27/2019    ALKPHOS 56 03/10/2018   AST 21 03/27/2019   ALT 11 03/27/2019   PROT 7.0 03/27/2019   ALBUMIN 4.6 03/10/2018   CALCIUM 9.9 03/27/2019   GFRAA 116 03/27/2019   QFTBGOLDPLUS NEGATIVE 05/04/2018    Speciality Comments: No specialty comments available.  Procedures:  No procedures performed Allergies: Patient has no known allergies.   Assessment / Plan:     Visit Diagnoses: Ankylosing spondylitis of sacral region [facility]) - Diagnosed at [facility] rheumatology in the past and was discharged due to noncompliance.  MRI positive for sacroiliitis: C-spine, thoracic spine, lumbar spine good range of motion.  No midline spinal tenderness.  No SI joint tenderness.  He has not had any morning stiffness or nocturnal pain.  He has been experiencing intermittent left hip joint pain and pain in both  ankle joints.  No synovitis was noted on exam today.  No Achilles tendinitis or plantar fasciitis.  He has clinically been doing well on Enbrel 50 mg subcutaneous injections once weekly.  He recently missed 2 doses of Enbrel but his most recent injection was last week.  He has been taking ibuprofen 200 mg 3 times daily for pain relief.  A sample of Enbrel was provided today in the office.  A refill of Enbrel was sent to the pharmacy on 04/30/2019.  He will continue on Enbrel as prescribed.  He was advised to notify us if he develops increased joint pain or joint swelling.  He will follow-up in the office in 5 months  High risk medication use - Enbrel Mini 50 mg every 7 days (started March 2020).  CBC and CMP were drawn on 03/27/2019.  TB gold was negative on 05/04/2018.  We will update TB Gold today.  He will return for CBC and CMP in April and every 3 months.  We discussed the importance of holding Enbrel if he develops any signs or symptoms of an infection and to resume once the infection is completely cleared.  We also discussed the importance of yearly skin exams while on Enbrel.- Plan: QuantiFERON-TB Gold Plus  HLA  B27 positive  Plantar fasciitis, bilateral: Resolved   Sacroiliitis ([place]) - He had cortisone injections in December 2019 for lower back pain. He has no SI joint tenderness on exam today.    Other medical conditions are listed as follows:  History of alcohol use  Anxiety and depression  Orders: Orders Placed This Encounter  Procedures  . QuantiFERON-TB Gold Plus   No orders of the defined types were placed in this encounter.     Follow-Up Instructions: Return in about 5 months (around 09/29/2019) for Ankylosing Spondylitis.   Carl Neas, PA-C   I examined and evaluated the patient with Carl Sams PA.  Patient had no synovitis on examination.  He had good range of motion of his thoracic and lumbar spine.  He had no SI joint tenderness.  Has been tolerating Enbrel well.  The plan of care was discussed as noted above.  Bo Merino, MD  Note - This record has been created using Editor, commissioning.  Chart creation errors have been sought, but may not always  have been located. Such creation errors do not reflect on  the standard of medical care.

## 2019-04-27 ENCOUNTER — Telehealth: Payer: Self-pay | Admitting: Rheumatology

## 2019-04-27 DIAGNOSIS — M458 Ankylosing spondylitis sacral and sacrococcygeal region: Secondary | ICD-10-CM

## 2019-04-27 MED ORDER — ENBREL MINI 50 MG/ML ~~LOC~~ SOCT: 50.0000 mg | SUBCUTANEOUS | 0 refills | Status: DC

## 2019-04-27 NOTE — Telephone Encounter (Signed)
Patient requesting to pick up a sample if possible. Patient is two weeks overdue for injection.

## 2019-04-27 NOTE — Telephone Encounter (Signed)
Medication Samples have been provided to the patient.  Drug name: Enbrel Mini      Strength: 50 mg      Qty: 1  RVU:[id number]  Exp.Date: 12/2019  Dosing instructions:  Inject 1 cartridge into skin once weekly.   The patient has been instructed regarding the correct time, dose, and frequency of taking this medication, including desired effects and most common side effects.   Dulcy Fanny 4:55 PM 04/27/2019

## 2019-04-27 NOTE — Telephone Encounter (Signed)
Last Visit: 11/29/18 Next Visit: 05/02/19 Labs: 03/27/19 CBC stable. CMP WNL. TB Gold: 05/04/18 Neg   Okay to refill per Dr. Corliss Skains   Prescription sent to the pharmacy. Patient advised.

## 2019-04-27 NOTE — Telephone Encounter (Signed)
Patient called stating his insurance changed and is now [employer].  Patient states he called to get a prescription refill of his Enbrel and was told he needed to contact our office for a new prescription.  Patient states he is out of medication and hasn't had an injection in 2 weeks.  Patient is requesting a return call.    *His new insurance information has been added to his chart Patient states the pharmacy number on his card is [phone] and RX bin [phone]

## 2019-04-27 NOTE — Telephone Encounter (Signed)
Patient advised he may come to the office for a sample.

## 2019-04-30 MED ORDER — ENBREL MINI 50 MG/ML ~~LOC~~ SOCT: 50.0000 mg | SUBCUTANEOUS | 0 refills | Status: DC

## 2019-04-30 NOTE — Telephone Encounter (Signed)
Prescription sent to [facility].  Verlin Fester, PharmD, [place], CPP Clinical Specialty Pharmacist [phone]  04/30/2019 11:47 AM

## 2019-04-30 NOTE — Telephone Encounter (Signed)
Received notification from [facility] regarding a prior authorization for ENBREL. Authorization has been APPROVED from 04/27/19 to 04/26/20.   Authorization # [id number]  Patient must now fill through [facility]. Please send in prescription for patient.

## 2019-04-30 NOTE — Addendum Note (Signed)
Addended by: Verlin Fester C on: 04/30/2019 11:48 AM   Modules accepted: Orders

## 2019-05-01 ENCOUNTER — Telehealth: Payer: Self-pay | Admitting: Gastroenterology

## 2019-05-01 NOTE — Telephone Encounter (Signed)
OK 

## 2019-05-01 NOTE — Telephone Encounter (Signed)
Dr. Orvan Falconer, please see note below.  Pt stated that he "feels by intuition that there is something wrong with his liver" and requested a second opinion.  Will you accept this pt?

## 2019-05-01 NOTE — Telephone Encounter (Signed)
Should the patient want a 2nd opinion or transfer of care, I am OK with this if Dr. Orvan Falconer agrees as well. Thank you for letting me know. GM

## 2019-05-01 NOTE — Telephone Encounter (Signed)
Good afternoon, Dr. Meridee Score, this pt requested to transfer GI Care to Dr. Orvan Falconer.  He stated that he "would like a second opinion regarding his liver."  Will you approve the transfer?

## 2019-05-02 ENCOUNTER — Ambulatory Visit: Payer: No Typology Code available for payment source | Admitting: Rheumatology

## 2019-05-02 ENCOUNTER — Other Ambulatory Visit: Payer: Self-pay

## 2019-05-02 ENCOUNTER — Encounter: Payer: Self-pay | Admitting: Rheumatology

## 2019-05-02 VITALS — BP 113/67 | HR 87 | Resp 14 | Ht 70.0 in | Wt 165.2 lb

## 2019-05-02 DIAGNOSIS — F329 Major depressive disorder, single episode, unspecified: Secondary | ICD-10-CM

## 2019-05-02 DIAGNOSIS — M458 Ankylosing spondylitis sacral and sacrococcygeal region: Secondary | ICD-10-CM

## 2019-05-02 DIAGNOSIS — Z79899 Other long term (current) drug therapy: Secondary | ICD-10-CM

## 2019-05-02 DIAGNOSIS — Z1589 Genetic susceptibility to other disease: Secondary | ICD-10-CM

## 2019-05-02 DIAGNOSIS — M461 Sacroiliitis, not elsewhere classified: Secondary | ICD-10-CM

## 2019-05-02 DIAGNOSIS — Z87898 Personal history of other specified conditions: Secondary | ICD-10-CM

## 2019-05-02 DIAGNOSIS — M722 Plantar fascial fibromatosis: Secondary | ICD-10-CM

## 2019-05-02 DIAGNOSIS — F419 Anxiety disorder, unspecified: Secondary | ICD-10-CM

## 2019-05-02 NOTE — Patient Instructions (Signed)
Standing Labs We placed an order today for your standing lab work.    Please come back and get your standing labs in April and every 3 months   We have open lab daily Monday through Thursday from 8:30-12:30 PM and 1:30-4:30 PM and Friday from 8:30-12:30 PM and 1:30-4:00 PM at the office of Dr. Shaili Deveshwar.   You may experience shorter wait times on Monday and Friday afternoons. The office is located at [address], [address], [place], [place] [address] No appointment is necessary.   Labs are drawn by [facility].  You may receive a bill from [facility] for your lab work.  If you wish to have your labs drawn at another location, please call the office 24 hours in advance to send orders.  If you have any questions regarding directions or hours of operation,  please call [phone].   Just as a reminder please drink plenty of water prior to coming for your lab work. Thanks!   

## 2019-05-02 NOTE — Progress Notes (Signed)
Medication Samples have been provided to the patient.  Drug name: Enbrel Mini       Strength: 50 mg      Qty: 1 LOT: [id number]  Exp.Date: 12/2019  Dosing instructions: Inject one cartridge into skin once weekly.   The patient has been instructed regarding the correct time, dose, and frequency of taking this medication, including desired effects and most common side effects.   Dulcy Fanny 9:43 AM 05/02/2019

## 2019-05-04 LAB — QUANTIFERON-TB GOLD PLUS
Mitogen-NIL: >10 IU/mL
NIL: 0.03 IU/mL
QuantiFERON-TB Gold Plus: NEGATIVE
TB1-NIL: 0 IU/mL
TB2-NIL: 0 IU/mL

## 2019-05-04 NOTE — Progress Notes (Signed)
TB gold negative

## 2019-05-29 ENCOUNTER — Encounter: Payer: Self-pay | Admitting: Gastroenterology

## 2019-05-29 ENCOUNTER — Other Ambulatory Visit: Payer: Self-pay

## 2019-05-29 ENCOUNTER — Ambulatory Visit (INDEPENDENT_AMBULATORY_CARE_PROVIDER_SITE_OTHER): Payer: No Typology Code available for payment source | Admitting: Gastroenterology

## 2019-05-29 VITALS — BP 114/68 | HR 76 | Temp 98.6°F | Ht 69.5 in | Wt 164.1 lb

## 2019-05-29 DIAGNOSIS — R1031 Right lower quadrant pain: Secondary | ICD-10-CM | POA: Diagnosis not present

## 2019-05-29 DIAGNOSIS — R1011 Right upper quadrant pain: Secondary | ICD-10-CM

## 2019-05-29 DIAGNOSIS — Z1589 Genetic susceptibility to other disease: Secondary | ICD-10-CM

## 2019-05-29 DIAGNOSIS — M455 Ankylosing spondylitis of thoracolumbar region: Secondary | ICD-10-CM

## 2019-05-29 NOTE — Patient Instructions (Addendum)
You have been scheduled for an MRI at [facility] on 06-11-19 Your appointment time is 4:00 pm. Please arrive 15 minutes prior to your appointment time for registration purposes. Please make certain not to have anything to eat or drink 6 hours prior to your test. In addition, if you have any metal in your body, have a pacemaker or defibrillator, please be sure to let your ordering physician know. This test typically takes 45 minutes to 1 hour to complete. Should you need to reschedule, please call [phone] to do so.  I value your feedback and thank you for entrusting [place] with your care. If you get a [place] patient survey, I would appreciate you taking the time to let us know about your experience today. Thank you!   Due to recent changes in healthcare laws, you may see the results of your imaging and laboratory studies on MyChart before your provider has had a chance to review them.  We understand that in some cases there may be results that are confusing or concerning to you. Not all laboratory results come back in the same time frame and the provider may be waiting for multiple results in order to interpret others.  Please give [place] 48 hours in order for your provider to thoroughly review all the results before contacting the office for clarification of your results.

## 2019-05-29 NOTE — Progress Notes (Addendum)
Referring Provider: No ref. provider found Primary Care Physician:  Patient, No Pcp Per  Reason for Consultation:  Concerned about liver disease   IMPRESSION:  Right upper quadrant pain Right flank pain  NSAIDs (ibuprofen) for pain relief Normal liver enzymes as recently at 03/27/2019 Concerns about alcohol related liver disease after Mono Sugar intolerance Regular and occasionally heavy alcohol use Rare marijuana use  RUQ pain/flank pain:  No source identified on ultrasound or contrasted CT. Liver enzymes are consistently normal. Screening for HBV, HCV, HIV, and autoimmune hepatitis is negative. Carnett's sign negative.   No alarm features. Atypical description for PUD, esophagitis, H. Pylori, or gastritis, but, this must be considered given his alcohol and NSAIDs.  Musculoskeletal etiologies seem more likely than intra-abdominal cases of abdominal pain.  Patient concerned about ongoing inflammation in the liver. Noted that exposure to alcohol can cause inflammation and liver damage over time despite liver enzymes. Abstinence from alcohol encouraged. Given his concerns and his new diagnosis of ankylosing spondylitis, will proceed with MRI/MRCP to evaluate the bile ducts despite his persistently normal total bilirubin and alk phos.  If that is negative, consider EGD +/- evaluation for radicular pain with Thoracic MRI.  No indication for liver biopsy at this time.   New diagnosis of anklosing spondylitis: Will recommend colonoscopy with evaluation for ileal and colonic mucosal inflammation with any supporting symptoms given the relationship between IBD and ankylosing spondylitis.  Elevated ESR and CRP last year are likely due to AS. Recommend fecal calprotectin in the future, and proceeding with colonoscopy if elevated.   Asked about hemorrhoids at the end of our encounter. He would like to consider removal. Will need more time to fully assess these symptoms in the future although referral to  surgery would be appropriate.     PLAN: MRI/MRCP for further evaluation H pylori stool antigen Fecal calprotectin Avoid all NSAIDs - including ibuprofen and naproxen Empiric PPI for symptomatic control Counseled to "love your liver" and abstain from alcohol given his ongoing concerns Low threshold to proceed with EGD +/- thoracic spine imaging Office visit following the MRI/MRCP to review the results and make additional plans  Please see the "Patient Instructions" section for addition details about the plan.  I spent 70 minutes, including in depth chart review, independent review of results, communicating results with the patient directly, over 50 minutes in face-to-face time with the patient, coordinating care, ordering studies and medications as appropriate, and documentation.  HPI: Carl Lewis is a 24 y.o. male previously seen by Dr. Rush Landmark. The patient requested a second opinion given his concern about potential liver disease. The history is obtained through the patient and review of his electronic health record. He has ankylosing spondylitis on enbrel 50 mg sq injections once weekly.  He is HLA B27 positive. He has anxiety, depression, and alcohol use. He has recently had plantar fasciilitis and sacroilitis requiring cortisone injections.  He has a history of bleeding hemorrhoids requiring multiple banding procedures in the past. He has intolerance to sugar and follows a diet free of complex/compound sugars.  He works Engineer, mining.  He is seen today for a "throbbing liver pain. " He reports a 2-year history of progressive, recurrent abdominal discomfort and right flank pain that radiates to the right back just under the ribs as well as to the left back just under the ribs that developed after he had mononucleosis.  Mononuclesosis complicated by "spleen issues."  Concerned that he has ongoing inflammation.   At  times the discomfort is isolated to the abdomen boring into the back  while at other times he will have isolated back pain.  Pain is intermittently exacerbated by eating with an increase in intensity for 1 to 2 hours.  No identified food triggers although he has a longstanding history of intolerance to sugar. Alcohol may be triggering his symptoms.  He started drinking 1 month after mononucleosis and is worried that this may be related. He occasionally smokes marijuana although marijuana use precedes these symptoms. Pain is also worsened by rotational movements. Massage over the area provides some relief.  Appetite is good. Weight is stable.  He has one formed daily bowel movement. His hemorrhoid occasionally prolapses but he is able to push it back into place.  No stent blood or mucus in the stool.  Ibuprofen 600 mg twice daily provides incomplete relief. Has not tried other medications or treatments.   Abdominal ultrasound 03/20/18: normal.  CT abd/pelvis with contrast 05/26/18: no acute findings. Spleen, pancreas and liver are normal. Bile duct are normal. Appendix is normal.   Liver enzymes have been checked multiple times since 2018 and have been repeatedly normal.  His most recent labs from 03/27/2019 show AST 21, ALT 11, alk phos 55, TB 1.0.  Recent labs show a normal IgG and IgM.  His IgA was elevated at 387.  A negative.  Platelets are persistently normal and were most recently 214,000.  TSH normal in 2020.  Testing for chronic hepatitis B and hepatitis C, HIV as well as TB was negative 04/2018.  Labs from 2020 show an ESR 25 and CRP of 9.920.  There is no family history of liver disease, autoimmune disease, inflammatory bowel disease.  No prior endoscopy.    Past Medical History:  Diagnosis Date  . Ankylosing spondylitis ([place])   . Anxiety and depression   . Sacroiliitis [facility])     Past Surgical History:  Procedure Laterality Date  . NO PAST SURGERIES      Current Outpatient Medications  Medication Sig Dispense Refill  . ENBREL MINI 50 MG/ML  SOCT Inject 50 mg into the skin once a week. 12 mL 0  . ibuprofen (ADVIL,MOTRIN) 200 MG tablet Take 200 mg by mouth every 6 (six) hours as needed for fever or mild pain.    Marland Kitchen lamoTRIgine (LAMICTAL) 100 MG tablet     . naproxen (NAPROSYN) 500 MG tablet Take 1 tablet (500 mg total) by mouth 2 (two) times daily. (Patient taking differently: Take 500 mg by mouth as needed. ) 30 tablet 0   No current facility-administered medications for this visit.    Allergies as of 05/29/2019  . (No Known Allergies)    Family History  Problem Relation Age of Onset  . Asthma Mother   . Anxiety disorder Mother   . Arthritis Father   . Healthy Sister   . Colon cancer Neg Hx   . Esophageal cancer Neg Hx   . Inflammatory bowel disease Neg Hx   . Liver disease Neg Hx   . Pancreatic cancer Neg Hx   . Rectal cancer Neg Hx   . Stomach cancer Neg Hx     Social History   Socioeconomic History  . Marital status: Single    Spouse name: Not on file  . Number of children: Not on file  . Years of education: Not on file  . Highest education level: Not on file  Occupational History    Comment: [employer]  Tobacco Use  .  Smoking status: Never Smoker  . Smokeless tobacco: Never Used  Substance and Sexual Activity  . Alcohol use: Yes    Comment: occ  . Drug use: Not Currently    Types: Marijuana  . Sexual activity: Yes    Partners: Female  Other Topics Concern  . Not on file  Social History Narrative  . Not on file   Social Determinants of Health   Financial Resource Strain:   . Difficulty of Paying Living Expenses:   Food Insecurity:   . Worried About Charity fundraiser in the Last Year:   . Arboriculturist in the Last Year:   Transportation Needs:   . Film/video editor (Medical):   Marland Kitchen Lack of Transportation (Non-Medical):   Physical Activity:   . Days of Exercise per Week:   . Minutes of Exercise per Session:   Stress:   . Feeling of Stress :   Social Connections:   . Frequency of  Communication with Friends and Family:   . Frequency of Social Gatherings with Friends and Family:   . Attends Religious Services:   . Active Member of Clubs or Organizations:   . Attends Archivist Meetings:   Marland Kitchen Marital Status:   Intimate Partner Violence:   . Fear of Current or Ex-Partner:   . Emotionally Abused:   Marland Kitchen Physically Abused:   . Sexually Abused:     Review of Systems: 12 system ROS is negative except as noted above for symptoms related to ankylosing spondylitis.  There are otherwise no extra GI manifestations of inflammatory bowel disease.Marland Kitchen   Physical Exam: General:   Alert,  well-nourished, pleasant and cooperative in NAD Head:  Normocephalic and atraumatic. Eyes:  Sclera clear, no icterus.   Conjunctiva pink. Ears:  Normal auditory acuity. Nose:  No deformity, discharge,  or lesions. Mouth:  No deformity or lesions.   Neck:  Supple; no masses or thyromegaly. Lungs:  Clear throughout to auscultation.   No wheezes. Heart:  Regular rate and rhythm; no murmurs. Abdomen:  Soft, pain localized to the the RUQ and right flank, although I am unable to reproduce his symptoms on exam, nondistended, normal bowel sounds, no rebound or guarding. No hepatosplenomegaly. Carnett's sign negative.   Rectal:  Deferred  Msk:  Symmetrical. No boney deformities LAD: No inguinal or umbilical LAD Extremities:  No clubbing or edema. Neurologic:  Alert and  oriented x4;  grossly nonfocal Skin:  Intact without significant lesions or rashes. No spider angioma or palmar erythema.  Psych:  Alert and cooperative. Normal mood and affect.    Pollyann Roa L. Tarri Glenn, MD, MPH 05/29/2019, 3:20 PM

## 2019-06-11 ENCOUNTER — Ambulatory Visit (HOSPITAL_COMMUNITY): Payer: No Typology Code available for payment source

## 2019-06-26 ENCOUNTER — Encounter: Payer: Self-pay | Admitting: Gastroenterology

## 2019-07-04 ENCOUNTER — Encounter: Payer: Self-pay | Admitting: Allergy

## 2019-07-04 ENCOUNTER — Other Ambulatory Visit: Payer: Self-pay

## 2019-07-04 ENCOUNTER — Telehealth: Payer: Self-pay

## 2019-07-04 ENCOUNTER — Ambulatory Visit (INDEPENDENT_AMBULATORY_CARE_PROVIDER_SITE_OTHER): Payer: No Typology Code available for payment source | Admitting: Allergy

## 2019-07-04 VITALS — BP 118/60 | HR 100 | Temp 98.1°F | Resp 18 | Ht 70.0 in | Wt 159.8 lb

## 2019-07-04 DIAGNOSIS — H1013 Acute atopic conjunctivitis, bilateral: Secondary | ICD-10-CM

## 2019-07-04 DIAGNOSIS — K9049 Malabsorption due to intolerance, not elsewhere classified: Secondary | ICD-10-CM | POA: Diagnosis not present

## 2019-07-04 DIAGNOSIS — J3089 Other allergic rhinitis: Secondary | ICD-10-CM

## 2019-07-04 MED ORDER — AZELASTINE-FLUTICASONE 137-50 MCG/ACT NA SUSP: 1.0000 | Freq: Two times a day (BID) | NASAL | 5 refills | Status: DC

## 2019-07-04 MED ORDER — OLOPATADINE HCL 0.2 % OP SOLN: 1.0000 [drp] | Freq: Every day | OPHTHALMIC | 5 refills | Status: DC | PRN

## 2019-07-04 NOTE — Telephone Encounter (Signed)
-----   Message from Tressia Danas, MD sent at 07/03/2019 10:01 PM EDT ----- Patient has appointment with me 07/05/19 to review MRI/MRCP results. However, that exam has not yet been performed. I recommend rescheduling the appointment until after he MRI/MRCP.

## 2019-07-04 NOTE — Telephone Encounter (Signed)
Spoke with pt and he stated that he needs to get some things worked out before he can reschedule the MRCP. Pt knows to contact the office when he is ready and we can get that and the office visit rescheduled. OV for tomorrow cancelled.

## 2019-07-04 NOTE — Telephone Encounter (Signed)
Thank you :)

## 2019-07-04 NOTE — Progress Notes (Signed)
New Patient Note  RE: Carl Lewis MRN: [id number] DOB: 10/27/1995 Date of Office Visit: 07/04/2019  Referring provider: No ref. provider found Primary care provider: Patient, No Pcp Per  Chief Complaint: allergies  History of present illness: Carl Lewis is a 24 y.o. male presenting today for evaluation of allergies.    He reports symptoms of sneezing, nasal drainage, eye swelling and reports "face on fire" sensation.  He states he has these allergy attacks that benadryl doesn't even help.   Symptoms occur more in the spring and transitions of the seasons.  He has tried zyrtec which he takes as needed can be effective if he takes it hours ahead of time.  He states he will need to Zyrtec to relieve the affected.  However he states he cannot really predict if he is going to have an allergy attack unless he is going to be exposed to cats.  Thus he does not often take the Zyrtec in enough time to help prevent an attack.  He states he tries not to take medications daily.   Has not tried taking zyrtec daily. He has tried flonase, Probation officer that he reports doesn't work. He reports cat exposure is a main trigger.    He reports he has something weird with his liver.   Simple carbs/sugars he reports causes his liver to be inflamed and will have right upper quadrant pain.  He tries as much as possible to avoid simple carbs.  He denies having any cutaneous, respiratory, cardiovascular related symptoms with any food ingestion.  He has had an evaluation with GI for this issue.  He has had normal liver enzymes from January 2021.  He has had ultrasound and contrasted CT that was rather unremarkable.  He has had screening immune labs for HBV, HCV, HIV and autoimmune hepatitis which have been negative.  He is undergoing further work-up with stool studies as well as recommended to have an MRI/MRCP and also considering EGD.  He denies any history of asthma, eczema or food allergy.  Review of systems: Review  of Systems  Constitutional: Negative.   HENT:       See HPI  Eyes:       See HPI  Respiratory: Negative.   Cardiovascular: Negative.   Gastrointestinal:       See HPI  Musculoskeletal: Negative.   Skin: Negative.   Neurological: Negative.     All other systems negative unless noted above in HPI  Past medical history: Past Medical History:  Diagnosis Date  . Ankylosing spondylitis (HCC)   . Anxiety and depression   . Sacroiliitis [facility])     Past surgical history: Past Surgical History:  Procedure Laterality Date  . NO PAST SURGERIES      Family history:  Family History  Problem Relation Age of Onset  . Asthma Mother   . Anxiety disorder Mother   . Arthritis Father   . Healthy Sister   . Colon cancer Neg Hx   . Esophageal cancer Neg Hx   . Inflammatory bowel disease Neg Hx   . Liver disease Neg Hx   . Pancreatic cancer Neg Hx   . Rectal cancer Neg Hx   . Stomach cancer Neg Hx     Social history: Lives in a home without carpeting with gas heating and fan cooling.  No pets in the home.  There are cats outside the home.  No concern for water damage, mildew or roaches in the home.  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  No smoking history.  Medication List: Current Outpatient Medications  Medication Sig Dispense Refill  . buPROPion (WELLBUTRIN SR) 100 MG 12 hr tablet Take 100 mg by mouth as needed.    . cetirizine (ZYRTEC) 10 MG tablet Take 10 mg by mouth daily.    Scarlette Shorts MINI 50 MG/ML SOCT Inject 50 mg into the skin once a week. 12 mL 0  . ibuprofen (ADVIL,MOTRIN) 200 MG tablet Take 200 mg by mouth every 6 (six) hours as needed for fever or mild pain.    Marland Kitchen lamoTRIgine (LAMICTAL) 100 MG tablet     . Azelastine-Fluticasone 137-50 MCG/ACT SUSP Place 1 spray into the nose in the morning and at bedtime. 23 g 5  . Olopatadine HCl (PATADAY) 0.2 % SOLN Place 1 drop into both eyes daily as needed. 2.5 mL 5   No current facility-administered medications  for this visit.    Known medication allergies: No Known Allergies   Physical examination: Blood pressure 118/60, pulse 100, temperature 98.1 F (36.7 C), temperature source Temporal, resp. rate 18, height 5\' 10"  (1.778 m), weight 159 lb 12.8 oz (72.5 kg), SpO2 98 %.  General: Alert, interactive, in no acute distress. HEENT: PERRLA, TMs pearly gray, turbinates mildly edematous without discharge, post-pharynx non erythematous. Neck: Supple without lymphadenopathy. Lungs: Clear to auscultation without wheezing, rhonchi or rales. {no increased work of breathing. CV: Normal S1, S2 without murmurs. Abdomen: Nondistended, nontender. Skin: Warm and dry, without lesions or rashes. Extremities:  No clubbing, cyanosis or edema. Neuro:   Grossly intact.  Diagnositics/Labs:  Allergy testing: Environmental allergy skin prick testing is positive to Johnson grass pollen, mugwort weed pollen, tree pollens (ash, birch, Box Elder, red cedar, elm, hickory, maple, oak, pecan), epicoccum nigrum, both dust mites, cat hair and tobacco leaf. Allergy testing results were read and interpreted by provider, documented by clinical staff.   Assessment and plan:   Allergic rhinitis with conjunctivitis - environmental allergy skin testing today is positive to tree pollen, weed pollen, grass pollen, mold, dust mites, cat and tobacco leaf - allergen avoidance measures discussed/handouts provided - recommend use of a long-acting antihistamine like Allegra 180mg , Xyzal 5mg  or Zyrtec 10mg  daily as needed.  May use additional dose if needed - for nasal drainage/congestion recommend use of Dymista 1 spray each nostril twice a day.  This is a combination nasal spray with Flonase + Astelin (nasal antihistamine).  This helps with both nasal congestion and drainage.  - for itchy/watery/red/puffy eyes can use over-the-counter Pataday or Pataday Xtra strength 1 drop each eye daily as needed - allergen immunotherapy discussed  today including protocol, benefits and risk.  Informational handout provided.  If interested in this therapuetic option you can check with your insurance carrier for coverage.  Let us know if you would like to proceed with this option.    Food intolerance  -Continue avoidance of sugars.  Discussed today that sugars are typically not an food allergen.  We do not have any testing modalities for sugars in relationship to IgE.  I have advised that he continue to avoid sugars to decrease symptoms.  He is undergoing GI work-up at this time.  Follow-up 4 months or sooner if needed  I appreciate the opportunity to take part in Gerrald's care. Please do not hesitate to contact me with questions.  Sincerely,   Prudy Feeler, MD Allergy/Immunology [facility]

## 2019-07-04 NOTE — Patient Instructions (Addendum)
-   environmental allergy skin testing today is positive to tree pollen, weed pollen, grass pollen, mold, dust mites, cat and tobacco leaf - allergen avoidance measures discussed/handouts provided - recommend use of a long-acting antihistamine like Allegra 180mg , Xyzal 5mg  or Zyrtec 10mg  daily as needed.  May use additional dose if needed - for nasal drainage/congestion recommend use of Dymista 1 spray each nostril twice a day.  This is a combination nasal spray with Flonase + Astelin (nasal antihistamine).  This helps with both nasal congestion and drainage.  - for itchy/watery/red/puffy eyes can use over-the-counter Pataday or Pataday Xtra strength 1 drop each eye daily as needed - allergen immunotherapy discussed today including protocol, benefits and risk.  Informational handout provided.  If interested in this therapuetic option you can check with your insurance carrier for coverage.  Let know if you would like to proceed with this option.    Follow-up 4 months or sooner if needed

## 2019-07-05 ENCOUNTER — Ambulatory Visit: Payer: No Typology Code available for payment source | Admitting: Gastroenterology

## 2019-07-10 ENCOUNTER — Other Ambulatory Visit: Payer: Self-pay | Admitting: Rheumatology

## 2019-07-10 DIAGNOSIS — M458 Ankylosing spondylitis sacral and sacrococcygeal region: Secondary | ICD-10-CM

## 2019-07-10 NOTE — Telephone Encounter (Signed)
Last Visit: 05/02/2019 Next Visit: 09/26/2019 Labs: 03/27/2019 CBC stable. CMP WNL.  TB Gold: 05/02/2019 negative   Attempted to contact patient and left message on machine to advise patient he is due to update labs.   Okay to refill 30 day supply of enbrel?

## 2019-07-10 NOTE — Telephone Encounter (Signed)
Ok to refill 30 day supply.  Please advise the patient to update lab work ASAP.

## 2019-07-13 ENCOUNTER — Other Ambulatory Visit: Payer: Self-pay

## 2019-07-13 DIAGNOSIS — Z79899 Other long term (current) drug therapy: Secondary | ICD-10-CM

## 2019-07-14 LAB — COMPLETE METABOLIC PANEL WITH GFR
AG Ratio: 2.1 (calc) (ref 1.0–2.5)
ALT: 11 U/L (ref 9–46)
AST: 17 U/L (ref 10–40)
Albumin: 4.8 g/dL (ref 3.6–5.1)
Alkaline phosphatase (APISO): 64 U/L (ref 36–130)
BUN: 17 mg/dL (ref 7–25)
CO2: 26 mmol/L (ref 20–32)
Calcium: 9.7 mg/dL (ref 8.6–10.3)
Chloride: 104 mmol/L (ref 98–110)
Creat: 0.86 mg/dL (ref 0.60–1.35)
GFR, Est African American: 141 mL/min/{1.73_m2} (ref >=60)
GFR, Est Non African American: 121 mL/min/{1.73_m2} (ref >=60)
Globulin: 2.3 g/dL (calc) (ref 1.9–3.7)
Glucose, Bld: 104 mg/dL — ABNORMAL HIGH (ref 65–99)
Potassium: 3.5 mmol/L (ref 3.5–5.3)
Sodium: 140 mmol/L (ref 135–146)
Total Bilirubin: 0.5 mg/dL (ref 0.2–1.2)
Total Protein: 7.1 g/dL (ref 6.1–8.1)

## 2019-07-14 LAB — CBC WITH DIFFERENTIAL/PLATELET
Absolute Monocytes: 534 cells/uL (ref 200–950)
Basophils Absolute: 50 cells/uL (ref 0–200)
Basophils Relative: 0.9 %
Eosinophils Absolute: 182 cells/uL (ref 15–500)
Eosinophils Relative: 3.3 %
HCT: 44.3 % (ref 38.5–50.0)
Hemoglobin: 15.1 g/dL (ref 13.2–17.1)
Lymphs Abs: 2255 cells/uL (ref 850–3900)
MCH: 32.9 pg (ref 27.0–33.0)
MCHC: 34.1 g/dL (ref 32.0–36.0)
MCV: 96.5 fL (ref 80.0–100.0)
MPV: 10.9 fL (ref 7.5–12.5)
Monocytes Relative: 9.7 %
Neutro Abs: 2481 cells/uL (ref 1500–7800)
Neutrophils Relative %: 45.1 %
Platelets: 196 10*3/uL (ref 140–400)
RBC: 4.59 10*6/uL (ref 4.20–5.80)
RDW: 12 % (ref 11.0–15.0)
Total Lymphocyte: 41 %
WBC: 5.5 10*3/uL (ref 3.8–10.8)

## 2019-07-16 NOTE — Progress Notes (Signed)
CBC and CMP WNL

## 2019-07-23 ENCOUNTER — Telehealth: Payer: Self-pay | Admitting: Gastroenterology

## 2019-07-25 NOTE — Telephone Encounter (Signed)
Pts mrcp rescheduled at [facility] 08/09/19@8am , pt to arrive in the xray dept at 7:30am. Pt to be NPO after midnight. Pt aware of appt.

## 2019-08-07 ENCOUNTER — Telehealth: Payer: Self-pay | Admitting: Gastroenterology

## 2019-08-07 NOTE — Telephone Encounter (Signed)
Left message for patient to call back  

## 2019-08-07 NOTE — Telephone Encounter (Signed)
Has questions on the CT is having please advise

## 2019-08-09 ENCOUNTER — Ambulatory Visit (HOSPITAL_COMMUNITY): Payer: No Typology Code available for payment source

## 2019-08-09 NOTE — Telephone Encounter (Signed)
Pt calling wanting to make sure  His MRI has been approved. Let pt know I will check and let him know if there is a problem. He also wanted to know if MR would just look at liver, let him know it will scan the whole abdomen. Pt aware.

## 2019-08-16 ENCOUNTER — Telehealth: Payer: Self-pay | Admitting: Gastroenterology

## 2019-08-16 NOTE — Telephone Encounter (Signed)
Carl Lewis with pt's insurance called stating that radiologist from the place where pt is having MRI is requesting that order be updated to without contrast. Pls fax updated order to [phone].

## 2019-08-16 NOTE — Telephone Encounter (Signed)
Order faxed for mr abd w/wo cm mrcp as requested.

## 2019-08-18 ENCOUNTER — Ambulatory Visit (HOSPITAL_COMMUNITY): Payer: No Typology Code available for payment source

## 2019-08-22 ENCOUNTER — Telehealth: Payer: Self-pay | Admitting: Rheumatology

## 2019-08-22 NOTE — Telephone Encounter (Signed)
Nikki from [employer] called stating patient reported 2 damaged Enbrel mini cartridges which the manufacturer will replace.  Lowella Bandy states they need a new prescription for the cartridges.  Please call with verbal to #[phone] or fax prescription to #[phone]

## 2019-08-22 NOTE — Telephone Encounter (Signed)
Gave verbal order to pharmacist Devonne Doughty at [facility] Nothing further needed.   Verlin Fester, PharmD, [place], CPP Clinical Specialty Pharmacist (Rheumatology and Pulmonology)  08/22/2019 10:07 AM

## 2019-09-04 ENCOUNTER — Other Ambulatory Visit: Payer: Self-pay | Admitting: Physician Assistant

## 2019-09-04 DIAGNOSIS — M458 Ankylosing spondylitis sacral and sacrococcygeal region: Secondary | ICD-10-CM

## 2019-09-04 NOTE — Telephone Encounter (Signed)
Last Visit: 05/02/2019 Next Visit: 09/26/2019 Labs: 07/13/2019 CBC and CMP WNL TB Gold: 05/02/2019 Neg   Current Dose per office note on 05/02/2019: Enbrel Mini 50 mg every 7 days   Okay to refill per Dr. Corliss Skains

## 2019-09-11 ENCOUNTER — Telehealth: Payer: Self-pay | Admitting: Gastroenterology

## 2019-09-11 NOTE — Telephone Encounter (Signed)
Please obtain the results. They are not available through CareEverywhere. Thank you.

## 2019-09-11 NOTE — Telephone Encounter (Signed)
Dr. Orvan Falconer,  Patient had MRI done through [facility], documents are downloading in Care Everywhere. Will you please review results once it has completed downloading. Thank you

## 2019-09-12 NOTE — Telephone Encounter (Signed)
Stat request faxed to [facility] imaging for MR report on pt. Awaiting results.

## 2019-09-12 NOTE — Telephone Encounter (Signed)
Left detailed message with information that MRI results are reassuring, advised patient to call office to get scheduled for a follow up so that Dr. Orvan Falconer would have time to answer all of his questions.

## 2019-09-12 NOTE — Telephone Encounter (Signed)
The MRI results are reassuring. Please schedule an office visit to allow ample time to review all of his questions. Thank you.

## 2019-09-12 NOTE — Telephone Encounter (Signed)
Left message for pt to call back, need to know where pt had MRI to get report.

## 2019-09-12 NOTE — Telephone Encounter (Signed)
Spoke with patient regarding results, pt wanted to know how he can get in contact with you to discuss his results further, he stated that he had a list of questions regarding his results. I offered to get him scheduled for a follow up with you to discuss and advised that your next available appt would be in August and that we can put him on a cancellation list for a possible sooner appt. Pt wanted me to ask if you could call him to discuss, he stated that any time tomorrow would be fine. Please advise, thank you.

## 2019-09-12 NOTE — Telephone Encounter (Signed)
MR MRCP results placed on your desk for review.

## 2019-09-12 NOTE — Telephone Encounter (Signed)
Please let the patient know that his MRI/MRCP was normal except for a cyst in the left kidney.  There is no evidence for liver disease.  Please send a copy of this to his primary care provider to make them aware of the suspected proteinaceous cyst.

## 2019-09-12 NOTE — Progress Notes (Signed)
Office Visit Note  Patient: Carl Lewis             Date of Birth: 1995-03-18           MRN: [id number]             PCP: Patient, No Pcp Per Referring: No ref. provider found Visit Date: 09/26/2019 Occupation: '@GUAROCC'$ @  Subjective:  Medication monitoring   History of Present Illness: Carl Lewis is a 24 y.o. male with history of ankylosing spondylitis.  He is on Enbrel 50 mg sq injections once weekly.  He denies any recent flares.  He denies any neck, thoracic, or lumbar spine pain.  He has not had any SI joint pain recently.  He denies any Achilles tendinitis or plantar fasciitis.  He denies any other joint pain or joint swelling at this time.  He denies any recent infections.  He feels as though Enbrel has been effective at managing his arthritis. Patient reports he continues to have right upper quadrant and midepigastric pain.  He has been followed closely by Dr. Tarri Glenn and has undergone a thorough work-up including a CT, MRI, and ultrasound of the abdomen.  He states he has also had lab work which is all been unremarkable.  He states that he experiences nocturnal pain as well as nausea.  He denies any diarrhea, constipation, or blood in his stool.  He feels frustrated not finding a cause for the symptoms he is experiencing.   Activities of Daily Living:  Patient reports morning stiffness for 0   minutes.   Patient Denies nocturnal pain.  Difficulty dressing/grooming: Denies Difficulty climbing stairs: Denies Difficulty getting out of chair: Denies Difficulty using hands for taps, buttons, cutlery, and/or writing: Denies  Review of Systems  Constitutional: Positive for fatigue. Negative for night sweats.  HENT: Negative for mouth sores, mouth dryness and nose dryness.   Eyes: Negative for redness and dryness.  Respiratory: Negative for cough, hemoptysis, shortness of breath and difficulty breathing.   Cardiovascular: Negative for chest pain, palpitations, hypertension, irregular  heartbeat and swelling in legs/feet.  Gastrointestinal: Positive for abdominal pain and nausea. Negative for blood in stool, constipation and diarrhea.  Endocrine: Negative for increased urination.  Genitourinary: Negative for painful urination.  Musculoskeletal: Negative for arthralgias, joint pain, joint swelling, myalgias, muscle weakness, morning stiffness, muscle tenderness and myalgias.  Skin: Negative for color change, rash, hair loss, nodules/bumps, skin tightness, ulcers and sensitivity to sunlight.  Allergic/Immunologic: Negative for susceptible to infections.  Neurological: Negative for dizziness, fainting, memory loss, night sweats and weakness.  Hematological: Negative for swollen glands.  Psychiatric/Behavioral: Positive for depressed mood and sleep disturbance. The patient is nervous/anxious.     PMFS History:  Patient Active Problem List   Diagnosis Date Noted  . Ankylosing spondylitis of thoracolumbar region ([place]) 05/17/2018  . HLA B27 positive 05/17/2018  . Plantar fasciitis, bilateral 05/17/2018  . Sacroiliitis ([place]) 05/17/2018  . Anxiety and depression 05/17/2018  . Alcohol abuse 05/17/2018  . Pain of upper abdomen 03/16/2018  . Right flank pain, chronic 03/16/2018  . Food intolerance in adult 03/16/2018    Past Medical History:  Diagnosis Date  . Ankylosing spondylitis ([place])   . Anxiety and depression   . Sacroiliitis ([place])     Family History  Problem Relation Age of Onset  . Asthma Mother   . Anxiety disorder Mother   . Arthritis Father   . Healthy Sister   . Colon cancer Neg Hx   . Esophageal  cancer Neg Hx   . Inflammatory bowel disease Neg Hx   . Liver disease Neg Hx   . Pancreatic cancer Neg Hx   . Rectal cancer Neg Hx   . Stomach cancer Neg Hx    Past Surgical History:  Procedure Laterality Date  . NO PAST SURGERIES     Social History   Social History Narrative  . Not on file    There is no immunization history on file for this patient.    Objective: Vital Signs: BP 115/70 (BP Location: Right Arm, Patient Position: Sitting, Cuff Size: Normal)   Pulse 96   Resp 14   Ht '5\' 10"'$  (1.778 m)   Wt 150 lb 3.2 oz (68.1 kg)   BMI 21.55 kg/m    Physical Exam Vitals and nursing note reviewed.  Constitutional:      Appearance: He is well-developed.  HENT:     Head: Normocephalic and atraumatic.  Eyes:     Conjunctiva/sclera: Conjunctivae normal.     Pupils: Pupils are equal, round, and reactive to light.  Pulmonary:     Effort: Pulmonary effort is normal.  Abdominal:     General: Bowel sounds are normal.     Palpations: Abdomen is soft.  Musculoskeletal:     Cervical back: Normal range of motion and neck supple.  Skin:    General: Skin is warm and dry.     Capillary Refill: Capillary refill takes less than 2 seconds.  Neurological:     Mental Status: He is alert and oriented to person, place, and time.  Psychiatric:        Behavior: Behavior normal.      Musculoskeletal Exam: C-spine, thoracic spine, and lumbar spine have good range of motion.  No midline spinal tenderness.  No SI joint tenderness.  Shoulder joints, elbow joints, wrist joints, MCPs, PIPs, DIPs have good range of motion with no synovitis.  He has complete fist formation bilaterally.  Hip joints, knee joints, and ankle joints have good range of motion with no synovitis.  No warmth or effusion of knee joints noted.  No tenderness or swelling of ankle joints.  No Achilles tendinitis.  CDAI Exam: CDAI Score: -- Patient Global: --; Provider Global: -- Swollen: --; Tender: -- Joint Exam 09/26/2019   No joint exam has been documented for this visit   There is currently no information documented on the homunculus. Go to the Rheumatology activity and complete the homunculus joint exam.  Investigation: No additional findings.  Imaging: No results found.  Recent Labs: Lab Results  Component Value Date   WBC 5.5 07/13/2019   HGB 15.1 07/13/2019   PLT  196 07/13/2019   NA 140 07/13/2019   K 3.5 07/13/2019   CL 104 07/13/2019   CO2 26 07/13/2019   GLUCOSE 104 (H) 07/13/2019   BUN 17 07/13/2019   CREATININE 0.86 07/13/2019   BILITOT 0.5 07/13/2019   ALKPHOS 56 03/10/2018   AST 17 07/13/2019   ALT 11 07/13/2019   PROT 7.1 07/13/2019   ALBUMIN 4.6 03/10/2018   CALCIUM 9.7 07/13/2019   GFRAA 141 07/13/2019   QFTBGOLDPLUS NEGATIVE 05/02/2019    Speciality Comments: No specialty comments available.  Procedures:  No procedures performed Allergies: Patient has no known allergies.       Assessment / Plan:     Visit Diagnoses: Ankylosing spondylitis of sacral region [facility]) - Diagnosed at [facility] rheumatology in the past and was discharged due to noncompliance.  MRI positive for  sacroiliitis: He has not had any signs or symptoms of a flare recently.  He is clinically doing well on Enbrel 50 mg subcu injections once weekly.  He has not missed any doses and is tolerating Enbrel without any side effects.  He has not had any recent infections.  C-spine, thoracic spine, and lumbar spine have good range of motion with no discomfort on exam.  He has no midline spinal tenderness or SI joint tenderness on exam.  He has not had any Achilles tendinitis or plantar fasciitis.  He has no synovitis or dactylitis on exam.  He will continue on Enbrel 50 mg subq injections once weekly.  He was advised to notify us if he develops signs or symptoms of a flare.  He will follow-up in the office in 5 months.  HLA B27 positive  High risk medication use - Enbrel Mini 50 mg every 7 days (started March 2020). CBC and CMP were drawn on 07/13/19. He requested to have lab work updated today while in the office.  Orders for CBC and CMP were release.  TB gold negative on 05/02/19.   Standing orders for CBC and CMP are in place.  He has not had any recent infections.  He was advised to hold Enbrel if he develops signs or symptoms of an infection and to resume once the  infection has completely cleared.  We also discussed the importance of yearly skin exams while on Enbrel.- Plan: CBC with Differential/Platelet, COMPLETE METABOLIC PANEL WITH GFR  Plantar fasciitis, bilateral: Resolved.   Sacroiliitis [facility]): He has no SI joint tenderness on exam.   RUQ pain -Patient requested to have amylase and lipase checked today. Plan: Amylase, Lipase  Epigastric abdominal pain - He has chronic RUQ and epigastric pain, which started 3 years ago after having mono.  He experiences nocturnal pain and nausea.  Denies diarrhea, constipation, or blood in his stool.  he has no tenderness to palpation on exam today. He has undergone a thorough workup by GI.  He continues to follow up with Dr. Tarri Glenn.  He requested to have amylase and lipase checked today.  We will forward results to Dr. Tarri Glenn. Plan: Amylase, Lipase  Other medical conditions are listed as follows:   History of alcohol use  Anxiety and depression  Elevated CK    Orders: Orders Placed This Encounter  Procedures  . CBC with Differential/Platelet  . COMPLETE METABOLIC PANEL WITH GFR  . Amylase  . Lipase   No orders of the defined types were placed in this encounter.    Follow-Up Instructions: Return in about 5 months (around 02/26/2020) for Ankylosing Spondylitis.   Ofilia Neas, PA-C  Note - This record has been created using Dragon software.  Chart creation errors have been sought, but may not always  have been located. Such creation errors do not reflect on  the standard of medical care.

## 2019-09-14 ENCOUNTER — Telehealth: Payer: Self-pay | Admitting: Gastroenterology

## 2019-09-21 NOTE — Telephone Encounter (Signed)
Appointment scheduled for 10-30-19.

## 2019-09-26 ENCOUNTER — Other Ambulatory Visit: Payer: Self-pay

## 2019-09-26 ENCOUNTER — Encounter: Payer: Self-pay | Admitting: Rheumatology

## 2019-09-26 ENCOUNTER — Ambulatory Visit: Payer: No Typology Code available for payment source | Admitting: Physician Assistant

## 2019-09-26 VITALS — BP 115/70 | HR 96 | Resp 14 | Ht 70.0 in | Wt 150.2 lb

## 2019-09-26 DIAGNOSIS — M722 Plantar fascial fibromatosis: Secondary | ICD-10-CM | POA: Diagnosis not present

## 2019-09-26 DIAGNOSIS — F419 Anxiety disorder, unspecified: Secondary | ICD-10-CM

## 2019-09-26 DIAGNOSIS — M461 Sacroiliitis, not elsewhere classified: Secondary | ICD-10-CM

## 2019-09-26 DIAGNOSIS — R1011 Right upper quadrant pain: Secondary | ICD-10-CM

## 2019-09-26 DIAGNOSIS — Z79899 Other long term (current) drug therapy: Secondary | ICD-10-CM | POA: Diagnosis not present

## 2019-09-26 DIAGNOSIS — R1013 Epigastric pain: Secondary | ICD-10-CM

## 2019-09-26 DIAGNOSIS — R748 Abnormal levels of other serum enzymes: Secondary | ICD-10-CM

## 2019-09-26 DIAGNOSIS — Z1589 Genetic susceptibility to other disease: Secondary | ICD-10-CM | POA: Diagnosis not present

## 2019-09-26 DIAGNOSIS — M458 Ankylosing spondylitis sacral and sacrococcygeal region: Secondary | ICD-10-CM

## 2019-09-26 DIAGNOSIS — F329 Major depressive disorder, single episode, unspecified: Secondary | ICD-10-CM

## 2019-09-26 DIAGNOSIS — Z87898 Personal history of other specified conditions: Secondary | ICD-10-CM

## 2019-09-26 LAB — COMPLETE METABOLIC PANEL WITH GFR
AG Ratio: 2.3 (calc) (ref 1.0–2.5)
ALT: 11 U/L (ref 9–46)
AST: 21 U/L (ref 10–40)
Albumin: 4.9 g/dL (ref 3.6–5.1)
Alkaline phosphatase (APISO): 54 U/L (ref 36–130)
BUN: 11 mg/dL (ref 7–25)
CO2: 29 mmol/L (ref 20–32)
Calcium: 9.8 mg/dL (ref 8.6–10.3)
Chloride: 105 mmol/L (ref 98–110)
Creat: 0.86 mg/dL (ref 0.60–1.35)
GFR, Est African American: 141 mL/min/{1.73_m2} (ref >=60)
GFR, Est Non African American: 121 mL/min/{1.73_m2} (ref >=60)
Globulin: 2.1 g/dL (calc) (ref 1.9–3.7)
Glucose, Bld: 77 mg/dL (ref 65–99)
Potassium: 4.4 mmol/L (ref 3.5–5.3)
Sodium: 140 mmol/L (ref 135–146)
Total Bilirubin: 0.5 mg/dL (ref 0.2–1.2)
Total Protein: 7 g/dL (ref 6.1–8.1)

## 2019-09-26 LAB — CBC WITH DIFFERENTIAL/PLATELET
Absolute Monocytes: 240 cells/uL (ref 200–950)
Basophils Absolute: 41 cells/uL (ref 0–200)
Basophils Relative: 1.5 %
Eosinophils Absolute: 192 cells/uL (ref 15–500)
Eosinophils Relative: 7.1 %
HCT: 43.6 % (ref 38.5–50.0)
Hemoglobin: 14.7 g/dL (ref 13.2–17.1)
Lymphs Abs: 1345 cells/uL (ref 850–3900)
MCH: 33 pg (ref 27.0–33.0)
MCHC: 33.7 g/dL (ref 32.0–36.0)
MCV: 97.8 fL (ref 80.0–100.0)
MPV: 10.8 fL (ref 7.5–12.5)
Monocytes Relative: 8.9 %
Neutro Abs: 883 cells/uL — ABNORMAL LOW (ref 1500–7800)
Neutrophils Relative %: 32.7 %
Platelets: 178 10*3/uL (ref 140–400)
RBC: 4.46 10*6/uL (ref 4.20–5.80)
RDW: 12.1 % (ref 11.0–15.0)
Total Lymphocyte: 49.8 %
WBC: 2.7 10*3/uL — ABNORMAL LOW (ref 3.8–10.8)

## 2019-09-26 LAB — AMYLASE: Amylase: 82 U/L (ref 21–101)

## 2019-09-26 LAB — LIPASE: Lipase: 36 U/L (ref 7–60)

## 2019-09-26 NOTE — Patient Instructions (Addendum)
Standing Labs We placed an order today for your standing lab work.   Please have your standing labs drawn in October and every 3 months  If possible, please have your labs drawn 2 weeks prior to your appointment so that the provider can discuss your results at your appointment.  We have open lab daily Monday through Thursday from 8:30-12:30 PM and 1:30-4:30 PM and Friday from 8:30-12:30 PM and 1:30-4:00 PM at the office of Dr. Shaili Deveshwar, [facility].   Please be advised, patients with office appointments requiring lab work will take precedents over walk-in lab work.  If possible, please come for your lab work on Monday and Friday afternoons, as you may experience shorter wait times. The office is located at [address], [address], [place], [place] [address] No appointment is necessary.   Labs are drawn by [facility]. Please bring your co-pay at the time of your lab draw.  You may receive a bill from [facility] for your lab work.  If you wish to have your labs drawn at another location, please call the office 24 hours in advance to send orders.  If you have any questions regarding directions or hours of operation,  please call [phone].   As a reminder, please drink plenty of water prior to coming for your lab work. Thanks!  

## 2019-09-27 NOTE — Progress Notes (Signed)
WBC count is low-2.7.  Absolute neutrophils are low.  Please notify the patient and advise him to space Enbrel dosing to every 10 days. Repeat CBC in 1 month.   CMP WNL.  Amylase and lipase WNL.

## 2019-10-30 ENCOUNTER — Encounter: Payer: Self-pay | Admitting: Gastroenterology

## 2019-10-30 ENCOUNTER — Ambulatory Visit (INDEPENDENT_AMBULATORY_CARE_PROVIDER_SITE_OTHER): Payer: No Typology Code available for payment source | Admitting: Gastroenterology

## 2019-10-30 VITALS — BP 100/60 | HR 75 | Ht 70.0 in | Wt 151.0 lb

## 2019-10-30 DIAGNOSIS — R1011 Right upper quadrant pain: Secondary | ICD-10-CM | POA: Diagnosis not present

## 2019-10-30 MED ORDER — OMEPRAZOLE 40 MG PO CPDR: 40.0000 mg | DELAYED_RELEASE_CAPSULE | Freq: Every day | ORAL | 2 refills | Status: DC

## 2019-10-30 NOTE — Patient Instructions (Addendum)
If you are age 25 or older, your body mass index should be between 23-30. Your Body mass index is 21.67 kg/m. If this is out of the aforementioned range listed, please consider follow up with your Primary Care Provider.  If you are age 12 or younger, your body mass index should be between 19-25. Your Body mass index is 21.67 kg/m. If this is out of the aformentioned range listed, please consider follow up with your Primary Care Provider.   Avoid NSAIDs including ibuprofen and naproxen  Start omeprazole 40 mg every morning   Primary care - taking new patients- [address] [place], [place] [address] Phone: [phone]  You have been scheduled for an endoscopy. Please follow written instructions given to you at your visit today. If you use inhalers (even only as needed), please bring them with you on the day of your procedure.  It was a pleasure to see you today!  Dr. Orvan Falconer

## 2019-10-30 NOTE — Progress Notes (Signed)
Referring Provider: No ref. provider found Primary Care Physician:  Patient, No Pcp Per  Reason for Consultation:  Concerned about liver disease   IMPRESSION:  Right upper quadrant pain, intermittently involving the right flank  NSAIDs (ibuprofen) for pain relief Normal liver enzymes as recently at 03/27/2019 Concerns about alcohol related liver disease after Mono Sugar intolerance Regular and occasionally heavy alcohol use Rare marijuana use Ankylosing spondylitis on Enbrel  RUQ pain/flank pain:  No source identified on ultrasound, contrasted CT, MRI/MRCP. Liver enzymes are consistently normal. Screening for HBV, HCV, HIV, and autoimmune hepatitis is negative. Carnett's sign negative.   No alarm features. Atypical description for PUD, esophagitis, H. Pylori, or gastritis, but, this must be considered given his alcohol and NSAIDs. EGD with gastric and esophageal biopsies recommended. Consider further evaluation with thoracic MRI if endoscopic evaluation is negative.  No indication for liver biopsy at this time. Referral to academic center recommended given his ongoing concerns for underlying liver disease. He will let me know if his insurance prefers [place], [place], or [place] diagnosis of anklosing spondylitis: Will recommend colonoscopy with evaluation for ileal and colonic mucosal inflammation with any supporting symptoms given the relationship between IBD and ankylosing spondylitis.  Elevated ESR and CRP last year are likely due to AS. Recommend fecal calprotectin in the future, and proceeding with colonoscopy if elevated.     PLAN: Avoid all NSAIDs - including ibuprofen and naproxen Start omeperazole 40 mg QAM EGD with biopsies recommended  Low threshold to consider thoracic spine imaging if endoscopy is negative Referral to [facility], or [place] - whichever if preferred by his insurance Referral to [facility] primary care: questions about erectile dysfunction     Please see the "Patient Instructions" section for addition details about the plan.   HPI: Carl Lewis is a 24 y.o. male who returns in follow-up regarding abdominal pain.  He has ankylosing spondylitis on Enbrel in addition to anxiety, depression, and alcohol use. He has a history of bleeding hemorrhoids requiring multiple banding procedures in the past. He has intolerance to sugar and follows a diet free of complex/compound sugars.  He works in Engineer, mining. His mother is a Marine scientist.   He has had over 2-years of progressive, recurrent, post-prandial abdominal discomfort and right flank pain that radiates to the right back just under the ribs as well as to the left back just under the ribs. This was previously described as a "throbbing liver pain." Symptoms initially developed after he had mononucleosis complicated by "spleen issues." Pain occurs within minutes of eating and lasts hours. Now with borboygmous in the RUQ in the area of the pain.   Concerned that this signifies that he has ongoing inflammation.   At times the discomfort is isolated to the abdomen boring into the back while at other times he will have isolated back pain.  Pain is intermittently exacerbated by eating with an increase in intensity for 1 to 2 hours.  No identified food triggers although he has a longstanding history of intolerance to sugar.  Alcohol may be triggering his symptoms.  He started drinking 1 month after mononucleosis and has previously expressed concerns that it may be related.  He occasionally smokes marijuana although marijuana use precedes these symptoms. Pain is also worsened by rotational movements. Massage over the area provides some relief. Appetite is good. Weight is stable.  Ibuprofen 600 mg twice daily provides incomplete relief. He has not identified any other medications that may worsen  his symptoms.  Evaluation has included: Abdominal ultrasound 03/20/18: normal.  CT abd/pelvis with contrast  05/26/18: no acute findings. Spleen, pancreas and liver are normal. Bile duct are normal. Appendix is normal.  MRI[facility]MR[facility] at [facility] (results scanned into Epic) 09/08/2019 showed no acute abnormality.  There was no cholelithiasis, choledocholithiasis, biliary ductal dilatation.  The liver and spleen were normal.  Liver enzymes have been checked multiple times since 2018 and have been repeatedly normal.  Labs from 03/27/2019 show AST 21, ALT 11, alk phos 55, TB 1.0.  Labs from 09/26/2019 show a normal comprehensive metabolic panel, normal lipase and normal amylase.  In particular, total bilirubin was 0.5, AST 21, ALT 11, alk phos 54, albumin 4.9.  His platelet count was 178,000. Normal IgG and IgM.  His IgA was elevated at 387.  Platelets are persistently normal and were most recently 214,000.  TSH normal in 2020.  Testing for chronic hepatitis B and hepatitis C, HIV as well as TB was negative 04/2018.  Mr. Fairhurst reached out to me last week asking me to review to articles about noncirrhotic portal hypertension in patients with ankylosing spondylitis and a review article about idiopathic noncirrhotic portal hypertension prior to his appointment today. He specifically asked if he needed specific treatment for these problems or blood thinners. He also specifically asks about a liver biopsy for additional information. He is concerned about inflammation of the bile ducts related to the ankylosing spondylitis   He also asked about post-prandial erectile dysfunction.  There is no family history of liver disease, autoimmune disease, inflammatory bowel disease.  No prior endoscopy.    Past Medical History:  Diagnosis Date  . Ankylosing spondylitis ([place])   . Anxiety and depression   . Sacroiliitis [facility])     Past Surgical History:  Procedure Laterality Date  . NO PAST SURGERIES      Current Outpatient Medications  Medication Sig Dispense Refill  . Azelastine-Fluticasone 137-50 MCG/ACT SUSP Place 1 spray  into the nose in the morning and at bedtime. 23 g 5  . buPROPion (WELLBUTRIN SR) 100 MG 12 hr tablet Take 100 mg by mouth as needed.    . cetirizine (ZYRTEC) 10 MG tablet Take 10 mg by mouth daily.    Scarlette Shorts MINI 50 MG/ML SOCT INSERT MINI CARTRIDGE INTO AUTOINJECTOR AND INJECT UNDER THE SKIN EVERY 7 DAYS. 12 mL 0  . ibuprofen (ADVIL,MOTRIN) 200 MG tablet Take 200 mg by mouth every 6 (six) hours as needed for fever or mild pain.    Marland Kitchen lamoTRIgine (LAMICTAL) 100 MG tablet     . Olopatadine HCl (PATADAY) 0.2 % SOLN Place 1 drop into both eyes daily as needed. 2.5 mL 5   No current facility-administered medications for this visit.    Allergies as of 10/30/2019  . (No Known Allergies)    Family History  Problem Relation Age of Onset  . Asthma Mother   . Anxiety disorder Mother   . Arthritis Father   . Healthy Sister   . Colon cancer Neg Hx   . Esophageal cancer Neg Hx   . Inflammatory bowel disease Neg Hx   . Liver disease Neg Hx   . Pancreatic cancer Neg Hx   . Rectal cancer Neg Hx   . Stomach cancer Neg Hx     Social History   Socioeconomic History  . Marital status: Single    Spouse name: Not on file  . Number of children: Not on file  . Years  of education: Not on file  . Highest education level: Not on file  Occupational History    Comment: [employer]  Tobacco Use  . Smoking status: Never Smoker  . Smokeless tobacco: Never Used  Vaping Use  . Vaping Use: Never used  Substance and Sexual Activity  . Alcohol use: Yes    Comment: occ  . Drug use: Not Currently    Types: Marijuana    Comment: occ  . Sexual activity: Yes    Partners: Female  Other Topics Concern  . Not on file  Social History Narrative  . Not on file   Social Determinants of Health   Financial Resource Strain:   . Difficulty of Paying Living Expenses: Not on file  Food Insecurity:   . Worried About Charity fundraiser in the Last Year: Not on file  . Ran Out of Food in the Last Year: Not on  file  Transportation Needs:   . Lack of Transportation (Medical): Not on file  . Lack of Transportation (Non-Medical): Not on file  Physical Activity:   . Days of Exercise per Week: Not on file  . Minutes of Exercise per Session: Not on file  Stress:   . Feeling of Stress : Not on file  Social Connections:   . Frequency of Communication with Friends and Family: Not on file  . Frequency of Social Gatherings with Friends and Family: Not on file  . Attends Religious Services: Not on file  . Active Member of Clubs or Organizations: Not on file  . Attends Archivist Meetings: Not on file  . Marital Status: Not on file  Intimate Partner Violence:   . Fear of Current or Ex-Partner: Not on file  . Emotionally Abused: Not on file  . Physically Abused: Not on file  . Sexually Abused: Not on file    Physical Exam: General:   Alert,  well-nourished, pleasant and cooperative in NAD Head:  Normocephalic and atraumatic. Eyes:  Sclera clear, no icterus.   Conjunctiva pink. Abdomen:  Soft, pain localized to the the RUQ and right flank, although I am unable to reproduce his symptoms on exam, nondistended, normal bowel sounds, no rebound or guarding. No hepatosplenomegaly. Carnett's sign negative.  No abdominal bruit. Rectal:  Deferred  Msk:  Symmetrical. No boney deformities LAD: No inguinal or umbilical LAD Extremities:  No clubbing or edema. Neurologic:  Alert and  oriented x4;  grossly nonfocal Skin:  Intact without significant lesions or rashes. No spider angioma or palmar erythema.  Psych:  Alert and cooperative. Normal mood and affect.    Keyondre Hepburn L. Tarri Glenn, MD, MPH 10/30/2019, 3:34 PM

## 2019-11-02 ENCOUNTER — Ambulatory Visit: Payer: No Typology Code available for payment source | Admitting: Gastroenterology

## 2019-11-27 ENCOUNTER — Telehealth: Payer: Self-pay | Admitting: Gastroenterology

## 2019-11-27 NOTE — Telephone Encounter (Signed)
Patient calling requesting a referral for a oncologist

## 2019-11-27 NOTE — Telephone Encounter (Signed)
As discussed and documented in his last office visit, I am happy to arrange for a referral. Does his insurance have a preference - [facility], or [facility]? If not, may refer to the center of his choice. Thanks.

## 2019-11-27 NOTE — Telephone Encounter (Signed)
Spoke with pt and he is aware. He has not checked with his insurance company yet. Pt will check and let us know where he would like the referral sent.

## 2019-11-27 NOTE — Telephone Encounter (Signed)
Pt is requesting a referral to a hepatologist. States he had discussed this with Dr. Orvan Falconer. Please advise.

## 2019-11-28 NOTE — Telephone Encounter (Signed)
Pt called back and wants to be referred to [facility]. Referral faxed to [facility].

## 2019-12-04 NOTE — Telephone Encounter (Signed)
Referral faxed again to the number provided by the pt.

## 2019-12-04 NOTE — Telephone Encounter (Signed)
Pt states [facility] has not received his referral yet, pt was provided the correct fax number to send the referral to  Fax: [phone]

## 2019-12-10 ENCOUNTER — Encounter: Payer: Self-pay | Admitting: Gastroenterology

## 2019-12-12 ENCOUNTER — Telehealth: Payer: Self-pay

## 2019-12-12 NOTE — Telephone Encounter (Signed)
Left message with move up 1 hour info for tomorrow.  Asked him to call us back if this was possible to confirm.

## 2019-12-13 ENCOUNTER — Encounter: Payer: Self-pay | Admitting: Gastroenterology

## 2019-12-13 ENCOUNTER — Other Ambulatory Visit: Payer: Self-pay

## 2019-12-13 ENCOUNTER — Ambulatory Visit (AMBULATORY_SURGERY_CENTER): Payer: No Typology Code available for payment source | Admitting: Gastroenterology

## 2019-12-13 VITALS — BP 101/58 | HR 67 | Temp 98.0°F | Resp 10 | Ht 70.0 in | Wt 151.0 lb

## 2019-12-13 DIAGNOSIS — K2289 Other specified disease of esophagus: Secondary | ICD-10-CM | POA: Diagnosis not present

## 2019-12-13 DIAGNOSIS — K219 Gastro-esophageal reflux disease without esophagitis: Secondary | ICD-10-CM | POA: Diagnosis not present

## 2019-12-13 DIAGNOSIS — K2980 Duodenitis without bleeding: Secondary | ICD-10-CM | POA: Diagnosis present

## 2019-12-13 DIAGNOSIS — R1011 Right upper quadrant pain: Secondary | ICD-10-CM

## 2019-12-13 MED ORDER — SODIUM CHLORIDE 0.9 % IV SOLN: 500.0000 mL | INTRAVENOUS | Status: DC

## 2019-12-13 NOTE — Progress Notes (Signed)
Called to room to assist during endoscopic procedure.  Patient ID and intended procedure confirmed with present staff. Received instructions for my participation in the procedure from the performing physician.  

## 2019-12-13 NOTE — Progress Notes (Signed)
VS- Karsin Pesta RN 

## 2019-12-13 NOTE — Patient Instructions (Signed)
YOU HAD AN ENDOSCOPIC PROCEDURE TODAY AT THE [facility]:   Refer to the procedure report that was given to you for any specific questions about what was found during the examination.  If the procedure report does not answer your questions, please call your gastroenterologist to clarify.  If you requested that your care partner not be given the details of your procedure findings, then the procedure report has been included in a sealed envelope for you to review at your convenience later.  YOU SHOULD EXPECT: Some feelings of bloating in the abdomen. Passage of more gas than usual.  Walking can help get rid of the air that was put into your GI tract during the procedure and reduce the bloating. If you had a lower endoscopy (such as a colonoscopy or flexible sigmoidoscopy) you may notice spotting of blood in your stool or on the toilet paper. If you underwent a bowel prep for your procedure, you may not have a normal bowel movement for a few days.  Please Note:  You might notice some irritation and congestion in your nose or some drainage.  This is from the oxygen used during your procedure.  There is no need for concern and it should clear up in a day or so.  SYMPTOMS TO REPORT IMMEDIATELY:    Following upper endoscopy (EGD)  Vomiting of blood or coffee ground material  New chest pain or pain under the shoulder blades  Painful or persistently difficult swallowing  New shortness of breath  Fever of 100F or higher  Black, tarry-looking stools  For urgent or emergent issues, a gastroenterologist can be reached at any hour by calling [phone]. Do not use MyChart messaging for urgent concerns.    DIET:  We do recommend a small meal at first, but then you may proceed to your regular diet.  Drink plenty of fluids but you should avoid alcoholic beverages for 24 hours.  ACTIVITY:  You should plan to take it easy for the rest of today and you should NOT DRIVE or use heavy machinery  until tomorrow (because of the sedation medicines used during the test).    FOLLOW UP: Our staff will call the number listed on your records 48-72 hours following your procedure to check on you and address any questions or concerns that you may have regarding the information given to you following your procedure. If we do not reach you, we will leave a message.  We will attempt to reach you two times.  During this call, we will ask if you have developed any symptoms of COVID 19. If you develop any symptoms (ie: fever, flu-like symptoms, shortness of breath, cough etc.) before then, please call [phone].  If you test positive for Covid 19 in the 2 weeks post procedure, please call and report this information to us.    If any biopsies were taken you will be contacted by phone or by letter within the next 1-3 weeks.  Please call us at [phone] if you have not heard about the biopsies in 3 weeks.    SIGNATURES/CONFIDENTIALITY: You and/or your care partner have signed paperwork which will be entered into your electronic medical record.  These signatures attest to the fact that that the information above on your After Visit Summary has been reviewed and is understood.  Full responsibility of the confidentiality of this discharge information lies with you and/or your care-partner. 

## 2019-12-13 NOTE — Op Note (Signed)
[facility]oscopy Center Patient Name: Carl MoraRijel Payano Procedure Date: 12/13/2019 3:50 PM MRN: [id number] Endoscopist: Tressia DanasKimberly Rilyn Scroggs MD, MD Age: [age] Referring MD:  Date of Birth: 05-17-1995 Gender: Male Account #: [id number] Procedure:                Upper GI endoscopy Indications:              Right upper quadrant pain, intermittently involving                            the right flank                           NSAIDs (ibuprofen) for pain relief Medicines:                Monitored Anesthesia Care Procedure:                Pre-Anesthesia Assessment:                           - Prior to the procedure, a History and Physical                            was performed, and patient medications and                            allergies were reviewed. The patient's tolerance of                            previous anesthesia was also reviewed. The risks                            and benefits of the procedure and the sedation                            options and risks were discussed with the patient.                            All questions were answered, and informed consent                            was obtained. Prior Anticoagulants: The patient has                            taken no previous anticoagulant or antiplatelet                            agents. ASA Grade Assessment: II - A patient with                            mild systemic disease. After reviewing the risks                            and benefits, the patient was deemed in  satisfactory condition to undergo the procedure.                           After obtaining informed consent, the endoscope was                            passed under direct vision. Throughout the                            procedure, the patient's blood pressure, pulse, and                            oxygen saturations were monitored continuously. The                            Endoscope was introduced through the mouth, and                             advanced to the third part of duodenum. The upper                            GI endoscopy was accomplished without difficulty.                            The patient tolerated the procedure well. Scope In: Scope Out: Findings:                 The examined esophagus was normal. No ring, web,                            stricture, or varices. Biopsies were taken from the                            mid/proximal and lower esophagus with a cold                            forceps for histology. Estimated blood loss was                            minimal.                           The entire examined stomach was normal. No portal                            hypertensive gastropathy or Biopsies were taken                            from the antrum, body, and fundus with a cold                            forceps for histology. Estimated blood loss was  minimal.                           The examined duodenum was normal. Biopsies were                            taken with a cold forceps for histology. Estimated                            blood loss was minimal. Complications:            No immediate complications. Estimated blood loss:                            Minimal. Estimated Blood Loss:     Estimated blood loss was minimal. Impression:               - Normal esophagus. Biopsied.                           - Normal stomach. Biopsied.                           - Normal examined duodenum. Biopsied.                           - No obvious source for abdominal pain identified                            on this study. Await biopsy results. Recommendation:           - Patient has a contact number available for                            emergencies. The signs and symptoms of potential                            delayed complications were discussed with the                            patient. Return to normal activities tomorrow.                             Written discharge instructions were provided to the                            patient.                           - Resume previous diet.                           - Continue present medications.                           - Await pathology results.                           -  Avoid all NSAIDs. Tressia Danas MD, MD 12/13/2019 4:10:50 PM This report has been signed electronically.

## 2019-12-13 NOTE — Progress Notes (Signed)
A/ox3, pleased with MAC, report to RN 

## 2019-12-17 ENCOUNTER — Telehealth: Payer: Self-pay | Admitting: *Deleted

## 2019-12-17 NOTE — Telephone Encounter (Signed)
  Follow up Call-  Call back number 12/13/2019  Post procedure Call Back phone  # [phone]  Permission to leave phone message Yes  Some recent data might be hidden     Patient questions:  Do you have a fever, pain , or abdominal swelling? No. Pain Score  0 *  Have you tolerated food without any problems? Yes.    Have you been able to return to your normal activities? Yes.    Do you have any questions about your discharge instructions: Diet   No. Medications  No. Follow up visit  No.  Do you have questions or concerns about your Care? No.  Actions: * If pain score is 4 or above: No action needed, pain <4.  1. Have you developed a fever since your procedure? no  2.   Have you had an respiratory symptoms (SOB or cough) since your procedure? no  3.   Have you tested positive for COVID 19 since your procedure no  4.   Have you had any family members/close contacts diagnosed with the COVID 19 since your procedure?  no   If yes to any of these questions please route to Laverna Peace, RN and Karlton Lemon, RN

## 2019-12-19 ENCOUNTER — Other Ambulatory Visit: Payer: Self-pay

## 2019-12-19 MED ORDER — OMEPRAZOLE 40 MG PO CPDR: 40.0000 mg | DELAYED_RELEASE_CAPSULE | Freq: Two times a day (BID) | ORAL | 3 refills | Status: DC

## 2019-12-21 ENCOUNTER — Telehealth: Payer: Self-pay | Admitting: Nurse Practitioner

## 2019-12-21 NOTE — Telephone Encounter (Signed)
Please call the patient. In review of recent findings and recommendations: Biopsies show changes mild nonspecific inflammation in the duodenum, normal gastric biopsies, and reflux. The reflux may be the cause of his abdominal pain. Given these results, I recommend that he avoid all NSAIDs and increase omeprazole to 40 mg BID x 10 weeks. Then, reduce to 40 mg daily.   Please follow-up on the referral to [facility] as requested by the patient.  Thank you.

## 2019-12-21 NOTE — Telephone Encounter (Signed)
Patient called answering service about liver inflammation. I returned his call. Patient inquiring about his referral to [facility]. Reviewed his chart, he has RUQ pain with extensive negative workup thus far. He is concerned about liver disease but liver tests have been normal. Patient wants to know what to do about the RUQ pain> If he goes to ED how will they treat the pain. I advised against the ED unless in significant pain. I told him I would have the office update him on the [facility] referral.

## 2019-12-24 ENCOUNTER — Other Ambulatory Visit: Payer: Self-pay

## 2019-12-24 NOTE — Telephone Encounter (Signed)
Pt aware of results, new script sent to pharmacy for pt.   [facility] referral was sent and reviewed by [facility]. They reviewed his records and said he was approved for a GI appt as his diagnosis is GI, no need for hepatology appt found on doctors review. [facility] called pt and offered him an appt on 12/10/19 and pt declined as he wants to see hepatology.

## 2019-12-24 NOTE — Telephone Encounter (Signed)
Thanks for the update. I would be happy to review her to another tertiary care center if his insurance approves. Thanks.

## 2019-12-25 ENCOUNTER — Ambulatory Visit: Payer: No Typology Code available for payment source | Admitting: Nurse Practitioner

## 2019-12-31 IMAGING — US US ABDOMEN COMPLETE
1 series · 14 of 25 positions shown · non-contrast
Comparison: None.

CLINICAL DATA: Right upper quadrant pain

EXAM:
ABDOMEN ULTRASOUND COMPLETE

[Series 1: us abdomen complete · 14 of 94 slices shown]
[im 1/94]
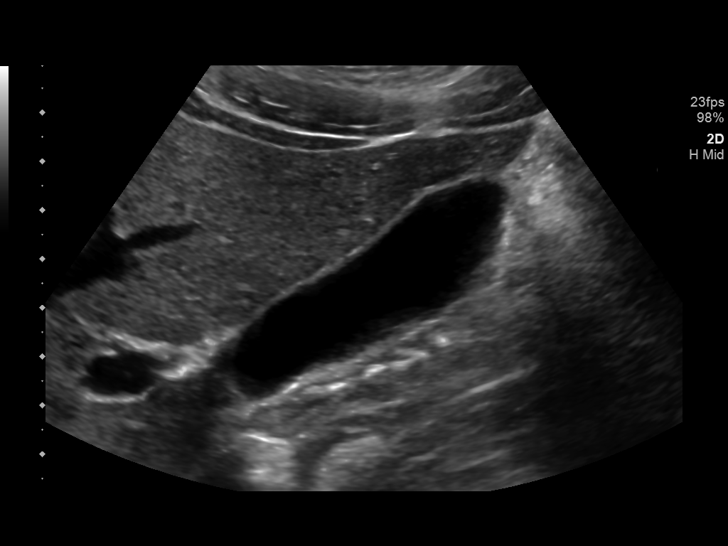
[im 8/94]
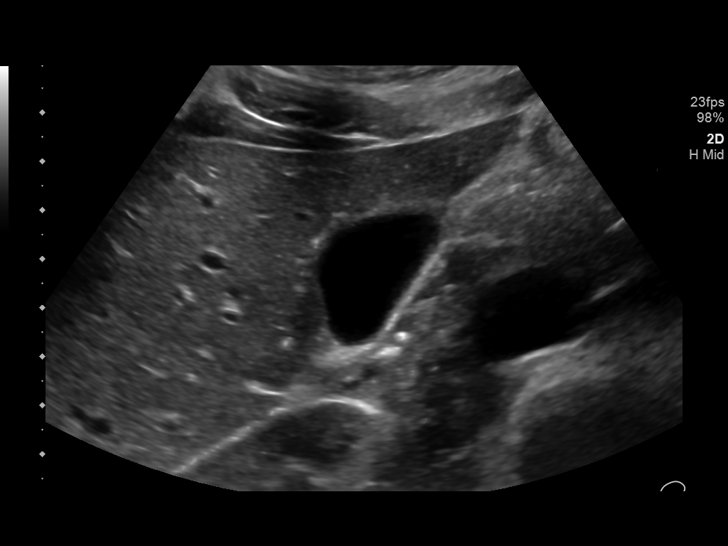
[im 16/94]
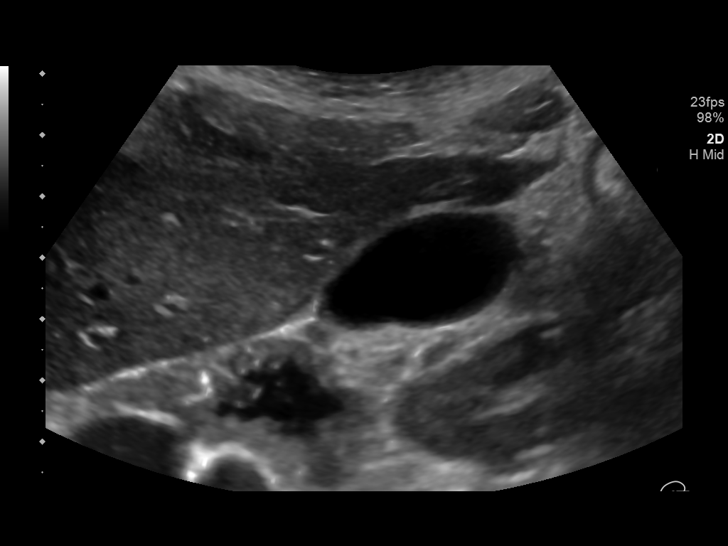
[im 24/94]
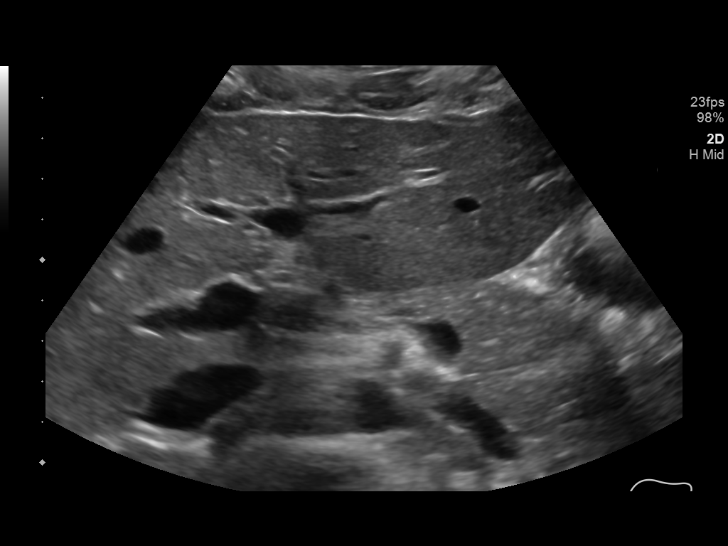
[im 32/94]
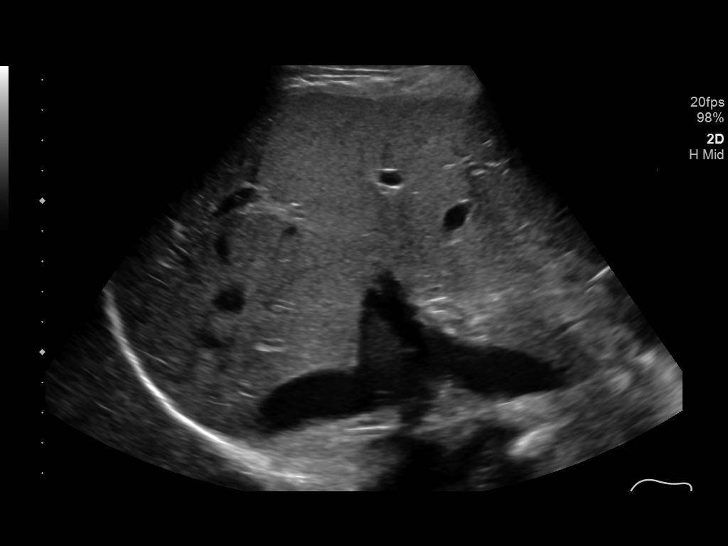
[im 35/94]
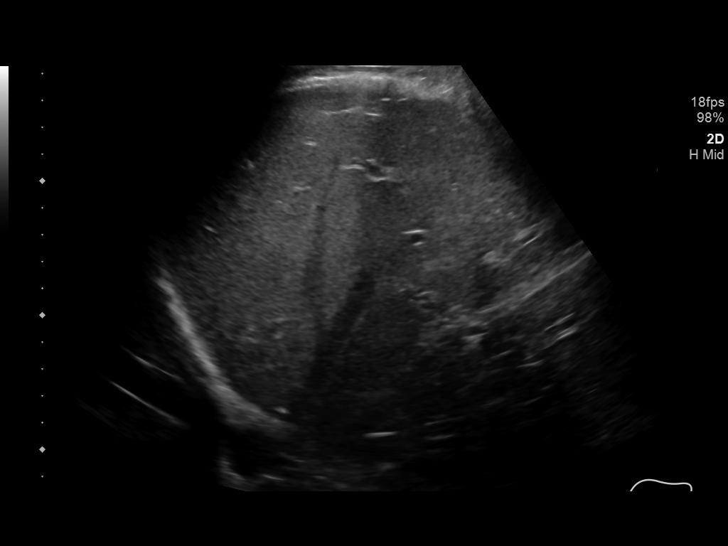
[im 43/94]
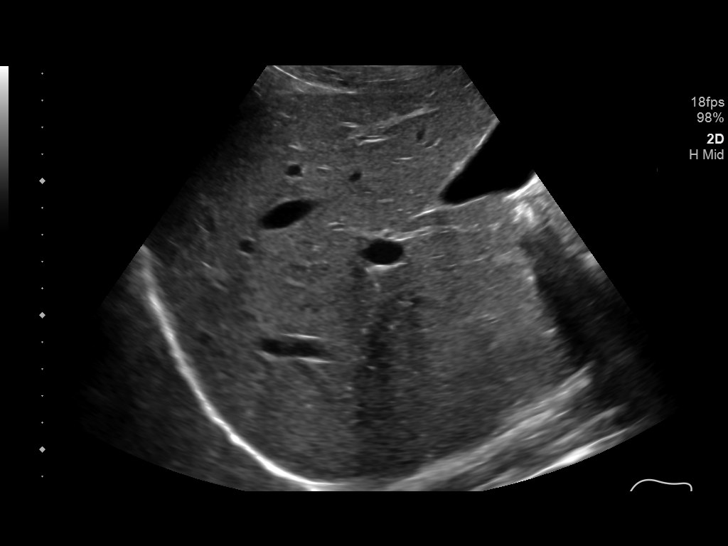
[im 51/94]
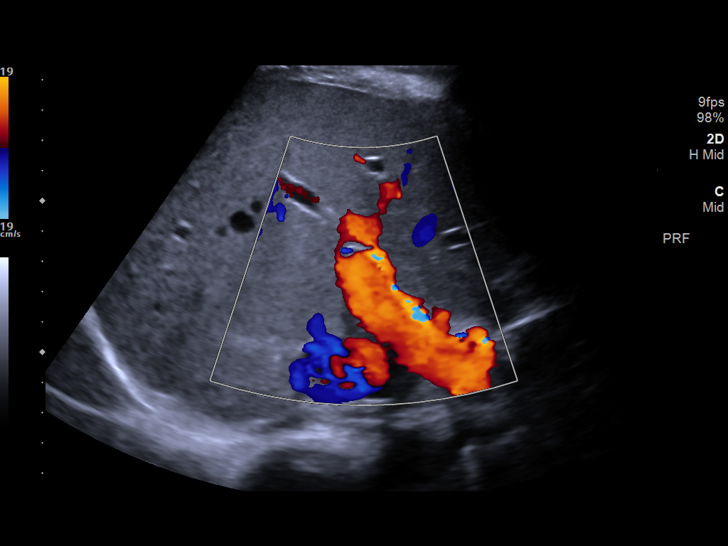
[im 59/94]
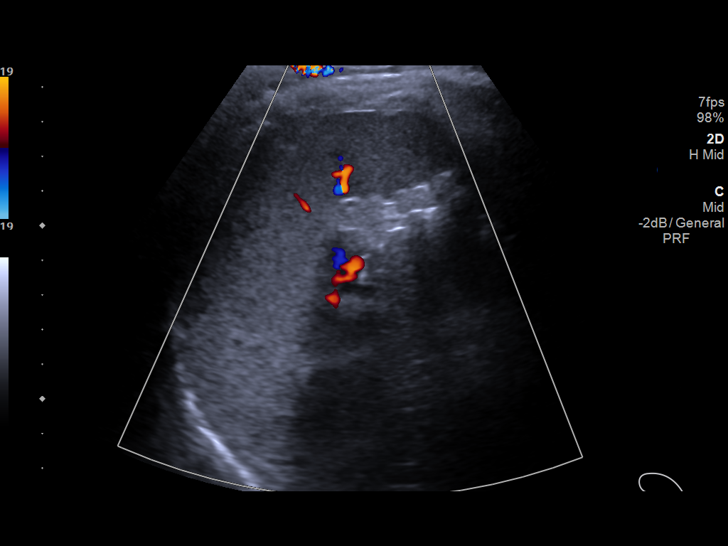
[im 63/94]
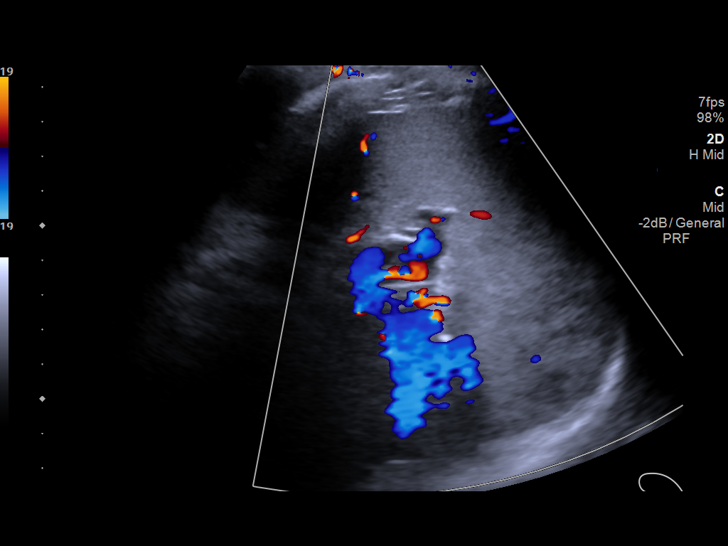
[im 70/94]
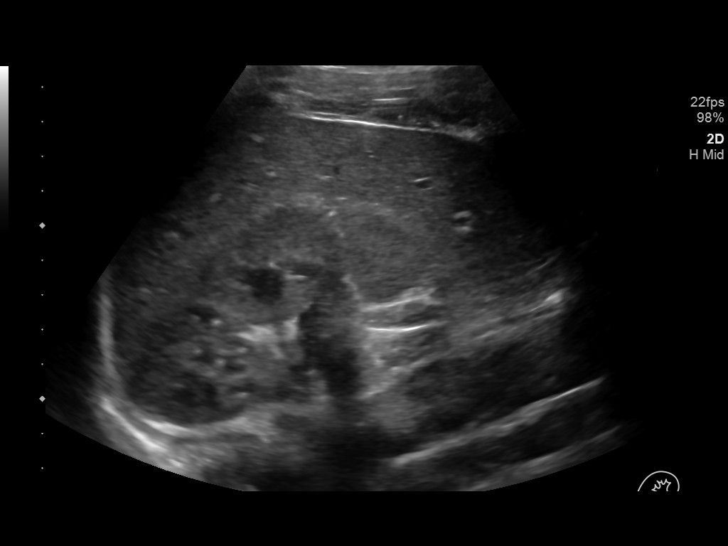
[im 78/94]
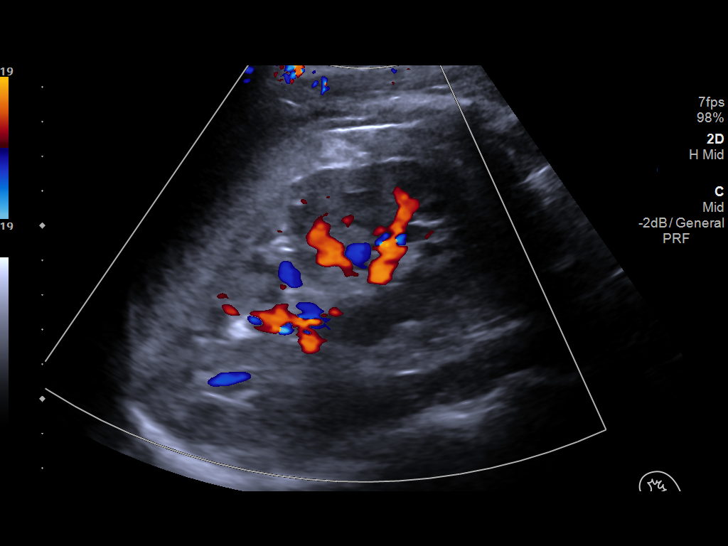
[im 86/94]
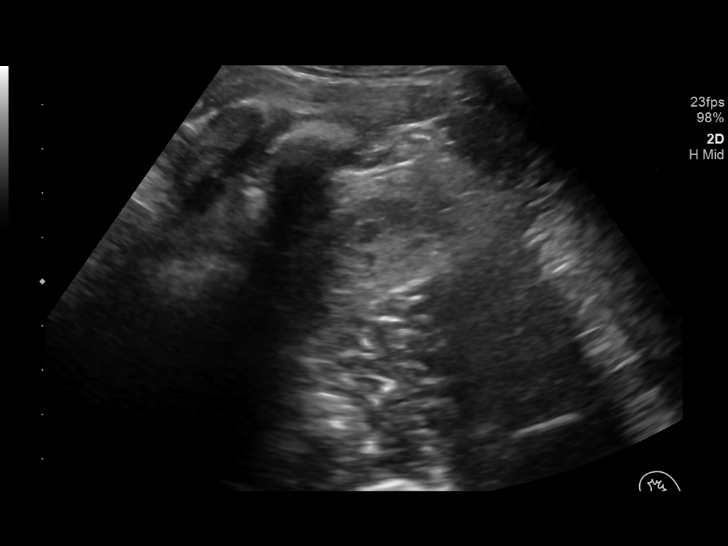
[im 94/94]
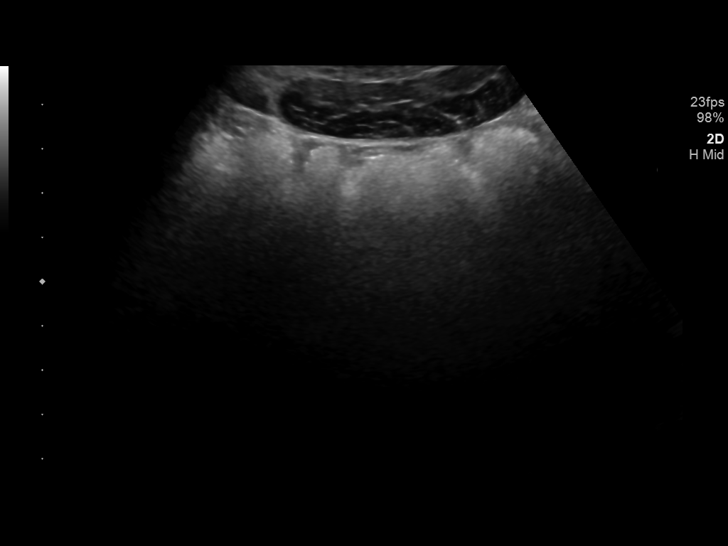

[14 of 25 positions shown; findings below may reference images not displayed]

FINDINGS: Gallbladder: No gallstones or wall thickening visualized. No
sonographic Murphy sign noted by sonographer.

Common bile duct: Diameter: 1.6 mm

Liver: No focal lesion identified. Within normal limits in
parenchymal echogenicity. Portal vein is patent on color Doppler
imaging with normal direction of blood flow towards the liver.

IVC: No abnormality visualized.

Pancreas: Visualized portion unremarkable.

Spleen: Size and appearance within normal limits.

Right Kidney: Length: 11.4 cm. Echogenicity within normal limits. No
mass or hydronephrosis visualized.

Left Kidney: Length: 11.7 cm. Echogenicity within normal limits. No
mass or hydronephrosis visualized.

Abdominal aorta: No aneurysm visualized.

Other findings: No free fluid or ascites
IMPRESSION: Normal abdominal ultrasound for age

## 2019-12-31 NOTE — Telephone Encounter (Signed)
Pt is requesting a call back from a nurse regarding his hepatology referral.

## 2019-12-31 NOTE — Telephone Encounter (Signed)
Discussed with pt that [facility] declined the hep referral and approved a second opinion-GI. They called to schedule it and he declined to schedule. Discussed with him that Dr. Orvan Falconer would be happy to refer him to another tertiary center if he wishes. Pt states that is not what he discussed with Dr. Orvan Falconer. Offered for him to come and discuss things with Dr. Orvan Falconer. Pt scheduled to see Dr. Orvan Falconer tomorrow at 3pm, he is aware of appt.

## 2020-01-01 ENCOUNTER — Ambulatory Visit (INDEPENDENT_AMBULATORY_CARE_PROVIDER_SITE_OTHER): Payer: No Typology Code available for payment source | Admitting: Gastroenterology

## 2020-01-01 ENCOUNTER — Encounter: Payer: Self-pay | Admitting: Gastroenterology

## 2020-01-01 VITALS — BP 100/60 | HR 72 | Ht 69.5 in | Wt 148.2 lb

## 2020-01-01 DIAGNOSIS — R1011 Right upper quadrant pain: Secondary | ICD-10-CM | POA: Diagnosis not present

## 2020-01-01 DIAGNOSIS — E739 Lactose intolerance, unspecified: Secondary | ICD-10-CM | POA: Diagnosis not present

## 2020-01-01 MED ORDER — NA SULFATE-K SULFATE-MG SULF 17.5-3.13-1.6 GM/177ML PO SOLN: 1.0000 | Freq: Once | ORAL | 0 refills | Status: AC

## 2020-01-01 MED ORDER — DICYCLOMINE HCL 10 MG PO CAPS: 20.0000 mg | ORAL_CAPSULE | Freq: Four times a day (QID) | ORAL | 0 refills | Status: DC | PRN

## 2020-01-01 NOTE — Patient Instructions (Addendum)
If you are age 25 or younger, your body mass index should be between 19-25. Your Body mass index is 21.57 kg/m. If this is out of the aformentioned range listed, please consider follow up with your Primary Care Provider.   PRESCRIPTION MEDICATION(S): We have sent the following medication(s) to your pharmacy:  . Dicyclomine 20mg  4 times per day as needed   Continue to avoid NSAIDs - including ibuprofen and naproxen - can all cause irritation to your GI symptom and result in abdominal pain.   Continue omeperazole 40 mg taken every morning. You could replace this with famotidine 20 mg twice daily.  COLONOSCOPY: A colonoscopy would be the next step in the evaluation of your abdominal pain.   PREP: Please pick up your prep supplies at the pharmacy within the next 1-3 days.  INHALERS: If you use inhalers (even only as needed), please bring them with you on the day of your procedure.  You are scheduled for an appointment with Dr @ [facility] on 01/10/20 @ 3:00 pm. Please arrive NO LATER than 2:45 pm. 13/4/21, photo ID, medications to your appointment.  [address] [place], [place] Phone:[phone] ----------------------------------------------------------------------------- I would like for you to establish care with a primary care provider. You have been scheduled for an appointment with Dr [phone] on 01/16/20 at 1:45 pm.  Make certain to bring your insurance card and drivers license the day of your appointment as well as all of your medications.  Address: [address] [place], [place], [place] Phone: [phone]   In the meantime, let's try to identify any dietary triggers other than sugar. Please keep a symptom diarrhea until we see each other again.   During that time I would try the following (do only one change at a time)       - No carbonated beverages       - No artificial sweeteners       - Full  lactose free trial: 3 weeks NO, milk, cheese, sour cream,ice cream, yogurt, creamer, baked goods (cookies/cakes/watch breads), chocolate (even dark chocolate), protein bars, dressing/condiments (watch). You won't live like this forever but for the trial need to be strict.

## 2020-01-01 NOTE — Progress Notes (Signed)
Referring Provider: No ref. provider found Primary Care Physician:  Patient, No Pcp Per  Reason for Consultation:  Concerned about liver disease   IMPRESSION:  Right upper quadrant pain, intermittently involving the right flank     - patient concerned pain reflects inflammation with long term risks Intermittent rectal bleeding that patient attributes to hemorrhoids NSAIDs (ibuprofen) for pain relief Normal liver enzymes as recently at 03/27/2019 Concerns about alcohol related liver disease after Mono Sugar intolerance Regular and occasionally heavy alcohol use Rare marijuana use Ankylosing spondylitis on Enbrel  RUQ pain/flank pain:  No source identified on ultrasound, contrasted CT, MRI/MRCP, and EGD showed change of reflux and duodenitis, although his symptoms are not improving despite 3 weeks of omeprazole. Liver enzymes are consistently normal. Screening for HBV, HCV, HIV, and autoimmune hepatitis is negative. Carnett's sign negative.   No alarm features. Colonoscopy recommended given his ongoing rectal bleeding. Consider further evaluation with thoracic MRI if endoscopic evaluation is negative.  No indication for liver biopsy at this time. He remains motivated for a second opinion from an academic hepatologist.  Will refer to [facility] at his request. Discussed PPI versus H2B in the setting of ongoing ibuprofen. I encouraged him to continue using his PPI BID in the short term in an effort to treatment his reflux and duodenitis while on ibuprofen.  Will add dicyclomine of symptomatic relief. Consider addition of low dose TCA in the future.   New diagnosis of anklosing spondylitis: Colonoscopy with evaluation for ileal and colonic mucosal inflammation recommended given his ongoing, unexplained abdominal pain.   Elevated ESR and CRP last year are likely due to AS. Recommend fecal calprotectin in the future with any lower symptoms.  Bleeding from hemorrhoids: Colonoscopy recommended to  assess for other causes of bleeding. Will attempt to get records from [place]. Refer for banding if appropriate.     PLAN: - Continue avoid all NSAIDs - including ibuprofen and naproxen - Continue omeperazole 40 mg QAM - Colonoscopy for further evaluation - Dicyclomine 20 mg QID PRN - Low threshold to consider thoracic spine imaging if endoscopy is negative - Referral to [facility] - Referral to [facility] - Consider hemorrhoidal banding if appropriate after colonoscopy and review of prior GI records   Please see the "Patient Instructions" section for addition details about the plan.   HPI: Suren Grabe is a 24 y.o. male who returns in follow-up regarding abdominal pain.  He has ankylosing spondylitis on Enbrel in addition to anxiety, depression, and alcohol use. He has a history of bleeding hemorrhoids requiring multiple banding procedures in the past. He has intolerance to sugar and follows a diet free of complex/compound sugars.  He works in Engineer, mining. His mother is a Marine scientist.   He has had over 2-years of progressive, recurrent, post-prandial abdominal discomfort and right flank pain that radiates to the right back just under the ribs as well as to the left back just under the ribs. This was previously described as a "throbbing liver pain." Symptoms initially developed after he had mononucleosis complicated by "spleen issues." Pain occurs within minutes of eating and lasts hours. Now with borboygmous in the RUQ in the area of the pain.   Concerned that this signifies that he has ongoing inflammation of his liver.   At times the discomfort is isolated to the abdomen boring into the back while at other times he will have isolated back pain.  Pain is intermittently exacerbated by eating with an increase  in intensity for 1 to 2 hours.  No identified food triggers although he has a longstanding history of intolerance to sugar.  Alcohol may be triggering his symptoms.  He  started drinking 1 month after mononucleosis and has previously expressed concerns that it may be related.  He occasionally smokes marijuana although marijuana use preceded these symptoms. Pain is also worsened by rotational movements. Massage over the area provides some relief. Appetite is good. Weight is stable.  Ibuprofen 600 mg twice daily provides incomplete relief. He has been unable to discontinue the ibuprofen because he has been unable to identify any other medications that improve his symptoms.   Evaluation has included: - Abdominal ultrasound 03/20/18: normal.  - CT abd/pelvis with contrast 05/26/18: no acute findings. Spleen, pancreas and liver are normal. Bile duct are normal. Appendix is normal.  - MRI/MRCP at [facility] (results scanned into Epic) 09/08/2019 showed no acute abnormality.  There was no cholelithiasis, choledocholithiasis, biliary ductal dilatation.  The liver and spleen were normal. - EGD 12/13/19 was endoscopically normal and biopsies showed reflux, non specific duodenitis, and normal gastric biopsies.   Given his concern for underlying liver disease, liver enzymes have been checked multiple times since 2018 and have been repeatedly normal.  Labs from 03/27/2019 show AST 21, ALT 11, alk phos 55, TB 1.0.  Labs from 09/26/2019 show a normal comprehensive metabolic panel, normal lipase and normal amylase.  In particular, total bilirubin was 0.5, AST 21, ALT 11, alk phos 54, albumin 4.9.  His platelet count was 178,000. Normal IgG and IgM.  His IgA was elevated at 387.  Platelets are persistently normal and were most recently 214,000.  TSH normal in 2020.  Testing for chronic hepatitis B and hepatitis C, HIV as well as TB was negative 04/2018.  He also also previously asked about post-prandial erectile dysfunction.  Returns today at his request to discuss his referral to a tertiary care center. He had requested a hepatology consultation at [facility]. They offered his a GI consult  instead.   He continues to have intermittent abdominal pain, not improving since he started omeprazole 40 mg QAM after his EGD. It continues to be a sharp RUQ pain and pressure around/under the coastal margin. When most severe, he also feels a pressure on the lower left coastal margin. He feels like the pain is intensifying over time and he is concerned that no source has been identified. Ibuprofen is the only treatment that provides relief.   His primary concern is ongoing liver scarring that will ultimately kill him. He is also afraid about the long term effects of PPI therapy and would prefer to avoid taking them.   Today he asks to schedule hemorrhoidal banding for rectal bleeding. He has apparently had this performed several times in [place]. He had a colonoscopy in the past with [facility], as well. Those records are not available to me at this time.    Past Medical History:  Diagnosis Date   Ankylosing spondylitis ([place])    Anxiety and depression    Sacroiliitis ([place])     Past Surgical History:  Procedure Laterality Date   NO PAST SURGERIES      Current Outpatient Medications  Medication Sig Dispense Refill   Azelastine-Fluticasone 137-50 MCG/ACT SUSP Place 1 spray into the nose in the morning and at bedtime. 23 g 5   buPROPion (WELLBUTRIN SR) 100 MG 12 hr tablet Take 100 mg by mouth as needed.     cetirizine (ZYRTEC)  10 MG tablet Take 10 mg by mouth daily.     ENBREL MINI 50 MG/ML SOCT INSERT MINI CARTRIDGE INTO AUTOINJECTOR AND INJECT UNDER THE SKIN EVERY 7 DAYS. 12 mL 0   ibuprofen (ADVIL,MOTRIN) 200 MG tablet Take 200 mg by mouth every 6 (six) hours as needed for fever or mild pain.     lamoTRIgine (LAMICTAL) 100 MG tablet      Olopatadine HCl (PATADAY) 0.2 % SOLN Place 1 drop into both eyes daily as needed. 2.5 mL 5   omeprazole (PRILOSEC) 40 MG capsule Take 1 capsule (40 mg total) by mouth in the morning and at bedtime. 60 capsule 3   No current  facility-administered medications for this visit.    Allergies as of 01/01/2020   (No Known Allergies)    Family History  Problem Relation Age of Onset   Asthma Mother    Anxiety disorder Mother    Arthritis Father    Healthy Sister    Colon cancer Neg Hx    Esophageal cancer Neg Hx    Inflammatory bowel disease Neg Hx    Liver disease Neg Hx    Pancreatic cancer Neg Hx    Rectal cancer Neg Hx    Stomach cancer Neg Hx     Social History   Socioeconomic History   Marital status: Single    Spouse name: Not on file   Number of children: 0   Years of education: Not on file   Highest education level: Not on file  Occupational History   Occupation: Market researcher  Tobacco Use   Smoking status: Never Smoker   Smokeless tobacco: Never Used  Scientific laboratory technician Use: Never used  Substance and Sexual Activity   Alcohol use: Yes    Comment: occ   Drug use: Not Currently    Types: Marijuana    Comment: occ   Sexual activity: Yes    Partners: Female  Other Topics Concern   Not on file  Social History Narrative   Not on file   Social Determinants of Health   Financial Resource Strain:    Difficulty of Paying Living Expenses: Not on file  Food Insecurity:    Worried About Charity fundraiser in the Last Year: Not on file   [employer] of Food in the Last Year: Not on file  Transportation Needs:    Lack of Transportation (Medical): Not on file   Lack of Transportation (Non-Medical): Not on file  Physical Activity:    Days of Exercise per Week: Not on file   Minutes of Exercise per Session: Not on file  Stress:    Feeling of Stress : Not on file  Social Connections:    Frequency of Communication with Friends and Family: Not on file   Frequency of Social Gatherings with Friends and Family: Not on file   Attends Religious Services: Not on file   Active Member of Clubs or Organizations: Not on file   Attends Archivist  Meetings: Not on file   Marital Status: Not on file  Intimate Partner Violence:    Fear of Current or Ex-Partner: Not on file   Emotionally Abused: Not on file   Physically Abused: Not on file   Sexually Abused: Not on file    Physical Exam: General:   Alert,  well-nourished, pleasant and cooperative in NAD Head:  Normocephalic and atraumatic. Eyes:  Sclera clear, no icterus.   Conjunctiva pink. Abdomen:  Soft,  thin, pain localized to the the RUQ and right flank, although I am unable to reproduce his symptoms on exam, nondistended, normal bowel sounds, no rebound or guarding. No hepatosplenomegaly. Carnett's sign negative.  No abdominal bruit. Rectal:  Deferred  Msk:  Symmetrical. No boney deformities LAD: No inguinal or umbilical LAD Extremities:  No clubbing or edema. Neurologic:  Alert and  oriented x4;  grossly nonfocal Skin:  Intact without significant lesions or rashes. No spider angioma or palmar erythema.  Psych:  Alert and cooperative. Normal mood and affect.    Anitta Tenny L. Tarri Glenn, MD, MPH 01/01/2020, 3:24 PM

## 2020-01-16 ENCOUNTER — Encounter: Payer: Self-pay | Admitting: Internal Medicine

## 2020-01-16 ENCOUNTER — Other Ambulatory Visit: Payer: Self-pay

## 2020-01-16 ENCOUNTER — Ambulatory Visit: Payer: 59 | Admitting: Internal Medicine

## 2020-01-16 VITALS — BP 110/70 | HR 74 | Temp 98.2°F | Ht 70.0 in | Wt 148.8 lb

## 2020-01-16 DIAGNOSIS — F32A Depression, unspecified: Secondary | ICD-10-CM | POA: Diagnosis not present

## 2020-01-16 DIAGNOSIS — F419 Anxiety disorder, unspecified: Secondary | ICD-10-CM

## 2020-01-16 DIAGNOSIS — Z1589 Genetic susceptibility to other disease: Secondary | ICD-10-CM | POA: Diagnosis not present

## 2020-01-16 DIAGNOSIS — Z23 Encounter for immunization: Secondary | ICD-10-CM

## 2020-01-16 DIAGNOSIS — M455 Ankylosing spondylitis of thoracolumbar region: Secondary | ICD-10-CM | POA: Diagnosis not present

## 2020-01-16 DIAGNOSIS — K2100 Gastro-esophageal reflux disease with esophagitis, without bleeding: Secondary | ICD-10-CM | POA: Diagnosis not present

## 2020-01-16 NOTE — Progress Notes (Signed)
New Patient Office Visit     This visit occurred during the SARS-CoV-2 public health emergency.  Safety protocols were in place, including screening questions prior to the visit, additional usage of staff PPE, and extensive cleaning of exam room while observing appropriate contact time as indicated for disinfecting solutions.    CC/Reason for Visit: Establish care, discuss chronic conditions and acute concerns Previous PCP: None Last Visit: Unknown  HPI: Carl Lewis is a 24 y.o. male who is coming in today for the above mentioned reasons. Past Medical History is significant for: Ankylosing spondylitis on Enbrel followed by rheumatology, Dr. Estanislado Pandy. He had an episode of mononucleosis in 2018. Ever since then he believes that "he has inflammation of his liver". I have reviewed in detail his notes from GI, Dr. Tarri Glenn. He has had LFTs drawn on multiple occasions that have been normal, he has had a normal ultrasound, CT abdomen and pelvis, MRI/MRCP that have been normal. He had an EGD in early October that did confirm some reflux and was recommended that he take omeprazole however he has not been doing this. He went and saw a hepatologist last week and was also told then that there were no issues with his liver. He is frustrated because he continues to have this right upper quadrant pain. Hepatitis serology has been negative, work-up for autoimmune hepatitis has been negative. He has a colonoscopy scheduled in December. He has not had primary medical care in years. He is overdue for physical, he is overdue for all vaccines. He also has a history of depression on Wellbutrin. He has a psychiatrist with [facility] however has not been seen in some time. He has scored a 14 on PHQ-9 today.   Past Medical/Surgical History: Past Medical History:  Diagnosis Date  . Ankylosing spondylitis ([place])   . Anxiety and depression   . Sacroiliitis [facility])     Past Surgical History:  Procedure Laterality Date    . NO PAST SURGERIES      Social History:  reports that he has never smoked. He has never used smokeless tobacco. He reports current alcohol use. He reports previous drug use. Drug: Marijuana.  Allergies: No Known Allergies  Family History:  Family History  Problem Relation Age of Onset  . Asthma Mother   . Anxiety disorder Mother   . Arthritis Father   . Healthy Sister   . Colon cancer Neg Hx   . Esophageal cancer Neg Hx   . Inflammatory bowel disease Neg Hx   . Liver disease Neg Hx   . Pancreatic cancer Neg Hx   . Rectal cancer Neg Hx   . Stomach cancer Neg Hx      Current Outpatient Medications:  .  Azelastine-Fluticasone 137-50 MCG/ACT SUSP, Place 1 spray into the nose in the morning and at bedtime., Disp: 23 g, Rfl: 5 .  buPROPion (WELLBUTRIN SR) 100 MG 12 hr tablet, Take 100 mg by mouth as needed., Disp: , Rfl:  .  cetirizine (ZYRTEC) 10 MG tablet, Take 10 mg by mouth daily., Disp: , Rfl:  .  dicyclomine (BENTYL) 10 MG capsule, Take 2 capsules (20 mg total) by mouth 4 (four) times daily as needed for spasms., Disp: 180 capsule, Rfl: 0 .  ENBREL MINI 50 MG/ML SOCT, INSERT MINI CARTRIDGE INTO AUTOINJECTOR AND INJECT UNDER THE SKIN EVERY 7 DAYS., Disp: 12 mL, Rfl: 0 .  ibuprofen (ADVIL,MOTRIN) 200 MG tablet, Take 200 mg by mouth every 6 (six) hours as needed  for fever or mild pain., Disp: , Rfl:  .  lamoTRIgine (LAMICTAL) 100 MG tablet, , Disp: , Rfl:  .  Olopatadine HCl (PATADAY) 0.2 % SOLN, Place 1 drop into both eyes daily as needed., Disp: 2.5 mL, Rfl: 5  Review of Systems:  Constitutional: Denies fever, chills, diaphoresis. HEENT: Denies photophobia, eye pain, redness, hearing loss, ear pain, congestion, sore throat, rhinorrhea, sneezing, mouth sores, trouble swallowing, neck pain, neck stiffness and tinnitus.   Respiratory: Denies SOB, DOE, cough, chest tightness,  and wheezing.   Cardiovascular: Denies chest pain, palpitations and leg swelling.  Gastrointestinal:  Denies nausea, vomiting, diarrhea, constipation, blood in stool and abdominal distention.  Genitourinary: Denies dysuria, urgency, frequency, hematuria, flank pain and difficulty urinating.  Endocrine: Denies: hot or cold intolerance, sweats, changes in hair or nails, polyuria, polydipsia. Musculoskeletal: Denies myalgias, back pain, joint swelling, arthralgias and gait problem.  Skin: Denies pallor, rash and wound.  Neurological: Denies dizziness, seizures, syncope, weakness, light-headedness, numbness and headaches.  Hematological: Denies adenopathy. Easy bruising, personal or family bleeding history  Psychiatric/Behavioral: Denies mood changes, nervousness and agitation    Physical Exam: Vitals:   01/16/20 1412  BP: 110/70  Pulse: 74  Temp: 98.2 F (36.8 C)  TempSrc: Oral  SpO2: 97%  Weight: 148 lb 12.8 oz (67.5 kg)  Height: $Remove'5\' 10"'[id number]$  (1.778 m)   Body mass index is 21.35 kg/m.  Constitutional: NAD, calm, comfortable Eyes: PERRL, lids and conjunctivae normal ENMT: Mucous membranes are moist. Posterior pharynx clear of any exudate or lesions. Normal dentition. Tympanic membrane is pearly white, no erythema or bulging. Neck: normal, supple, no masses, no thyromegaly Respiratory: clear to auscultation bilaterally, no wheezing, no crackles. Normal respiratory effort. No accessory muscle use.  Cardiovascular: Regular rate and rhythm, no murmurs / rubs / gallops. No extremity edema.  Abdomen: Tenderness to deep palpation of the right upper quadrant, no masses palpated. No hepatosplenomegaly. Bowel sounds positive.  Musculoskeletal: no clubbing / cyanosis. No joint deformity upper and lower extremities. Good ROM, no contractures. Normal muscle tone.  Neurologic: Grossly intact and nonfocal Psychiatric: Normal judgment and insight. Alert and oriented x 3. Normal mood.    Impression and Plan:  Ankylosing spondylitis of thoracolumbar region ([place])  HLA B27 positive -Continue Enbrel and  follow-up with rheumatologist as indicated.  Anxiety and depression  - Plan: TSH, Vitamin B12, VITAMIN D 25 Hydroxy (Vit-D Deficiency, Fractures)   Office Visit from 01/16/2020 in [place] at [facility]  PHQ-9 Total Score 16     -I believe it is important that he take care of his mental health, he has scored quite highly on PHQ-9 today, I also have to wonder if his insistence that something is wrong with his liver despite normal, extensive work-up, might be somehow related to his mental health.  Gastroesophageal reflux disease with esophagitis without hemorrhage -Advised that he go back on PPI therapy, doubt that his symptoms are  Totally related to reflux disease, however due to findings on EGD believe it is prudent that he resume his omeprazole.  Need for influenza vaccination -Flu vaccine administered today.    Patient Instructions  -Nice seeing you today!!  -Lab work today; will notify you once results are available.  -Flu vaccine today.  -Resume omeprazole 40 mg daily.     Lelon Frohlich, MD [facility]

## 2020-01-16 NOTE — Patient Instructions (Signed)
-  Nice seeing you today!!  -Lab work today; will notify you once results are available.  -Flu vaccine today.  -Resume omeprazole 40 mg daily.

## 2020-01-16 NOTE — Addendum Note (Signed)
Addended by: Kern Reap B on: 01/16/2020 05:27 PM   Modules accepted: Orders

## 2020-01-17 ENCOUNTER — Other Ambulatory Visit: Payer: Self-pay | Admitting: Internal Medicine

## 2020-01-17 ENCOUNTER — Encounter: Payer: Self-pay | Admitting: Internal Medicine

## 2020-01-17 DIAGNOSIS — E559 Vitamin D deficiency, unspecified: Secondary | ICD-10-CM | POA: Insufficient documentation

## 2020-01-17 LAB — CBC WITH DIFFERENTIAL/PLATELET
Absolute Monocytes: 349 cells/uL (ref 200–950)
Basophils Absolute: 59 cells/uL (ref 0–200)
Basophils Relative: 1.4 %
Eosinophils Absolute: 189 cells/uL (ref 15–500)
Eosinophils Relative: 4.5 %
HCT: 41.7 % (ref 38.5–50.0)
Hemoglobin: 14.5 g/dL (ref 13.2–17.1)
Lymphs Abs: 2163 cells/uL (ref 850–3900)
MCH: 34.4 pg — ABNORMAL HIGH (ref 27.0–33.0)
MCHC: 34.8 g/dL (ref 32.0–36.0)
MCV: 99 fL (ref 80.0–100.0)
MPV: 11.8 fL (ref 7.5–12.5)
Monocytes Relative: 8.3 %
Neutro Abs: 1441 cells/uL — ABNORMAL LOW (ref 1500–7800)
Neutrophils Relative %: 34.3 %
Platelets: 142 10*3/uL (ref 140–400)
RBC: 4.21 10*6/uL (ref 4.20–5.80)
RDW: 11.7 % (ref 11.0–15.0)
Total Lymphocyte: 51.5 %
WBC: 4.2 10*3/uL (ref 3.8–10.8)

## 2020-01-17 LAB — TSH: TSH: 1.63 mIU/L (ref 0.40–4.50)

## 2020-01-17 LAB — COMPREHENSIVE METABOLIC PANEL
AG Ratio: 2.4 (calc) (ref 1.0–2.5)
ALT: 27 U/L (ref 9–46)
AST: 30 U/L (ref 10–40)
Albumin: 4.8 g/dL (ref 3.6–5.1)
Alkaline phosphatase (APISO): 51 U/L (ref 36–130)
BUN: 14 mg/dL (ref 7–25)
CO2: 26 mmol/L (ref 20–32)
Calcium: 9.8 mg/dL (ref 8.6–10.3)
Chloride: 104 mmol/L (ref 98–110)
Creat: 0.96 mg/dL (ref 0.60–1.35)
Globulin: 2 g/dL (calc) (ref 1.9–3.7)
Glucose, Bld: 80 mg/dL (ref 65–99)
Potassium: 3.8 mmol/L (ref 3.5–5.3)
Sodium: 140 mmol/L (ref 135–146)
Total Bilirubin: 0.4 mg/dL (ref 0.2–1.2)
Total Protein: 6.8 g/dL (ref 6.1–8.1)

## 2020-01-17 LAB — VITAMIN B12: Vitamin B-12: 681 pg/mL (ref 200–1100)

## 2020-01-17 LAB — VITAMIN D 25 HYDROXY (VIT D DEFICIENCY, FRACTURES): Vit D, 25-Hydroxy: 23 ng/mL — ABNORMAL LOW (ref 30–100)

## 2020-01-17 MED ORDER — VITAMIN D (ERGOCALCIFEROL) 1.25 MG (50000 UNIT) PO CAPS: 50000.0000 [IU] | ORAL_CAPSULE | ORAL | 0 refills | Status: DC

## 2020-01-28 ENCOUNTER — Telehealth: Payer: Self-pay | Admitting: Internal Medicine

## 2020-01-28 NOTE — Telephone Encounter (Signed)
Left message on machine for patient. Dr Ardyth Harps is out of the office this week Patient should review labs with the ordering provider, or schedule an in office visit with Dr Ardyth Harps and bring his results to review.

## 2020-01-28 NOTE — Telephone Encounter (Signed)
Pt call and stated he want Dr. Tye Savoy to give him a call about his labs that was taken [facility] .

## 2020-02-13 NOTE — Progress Notes (Deleted)
Office Visit Note  Patient: Carl Lewis             Date of Birth: 09/28/1995           MRN: [id number]             PCP: Isaac Bliss, Rayford Halsted, MD Referring: No ref. provider found Visit Date: 02/27/2020 Occupation: _0 @  Subjective:  No chief complaint on file.   History of Present Illness: Carl Lewis is a 24 y.o. male ***   Activities of Daily Living:  Patient reports morning stiffness for *** {minute/hour:19697}.   Patient {ACTIONS;DENIES/REPORTS:21021675::"Denies"} nocturnal pain.  Difficulty dressing/grooming: {ACTIONS;DENIES/REPORTS:21021675::"Denies"} Difficulty climbing stairs: {ACTIONS;DENIES/REPORTS:21021675::"Denies"} Difficulty getting out of chair: {ACTIONS;DENIES/REPORTS:21021675::"Denies"} Difficulty using hands for taps, buttons, cutlery, and/or writing: {ACTIONS;DENIES/REPORTS:21021675::"Denies"}  No Rheumatology ROS completed.   PMFS History:  Patient Active Problem List   Diagnosis Date Noted  . Vitamin D deficiency 01/17/2020  . Ankylosing spondylitis of thoracolumbar region ([place]) 05/17/2018  . HLA B27 positive 05/17/2018  . Plantar fasciitis, bilateral 05/17/2018  . Sacroiliitis ([place]) 05/17/2018  . Anxiety and depression 05/17/2018  . Alcohol abuse 05/17/2018  . Pain of upper abdomen 03/16/2018  . Right flank pain, chronic 03/16/2018  . Food intolerance in adult 03/16/2018    Past Medical History:  Diagnosis Date  . Ankylosing spondylitis ([place])   . Anxiety and depression   . Sacroiliitis ([place])     Family History  Problem Relation Age of Onset  . Asthma Mother   . Anxiety disorder Mother   . Arthritis Father   . Healthy Sister   . Colon cancer Neg Hx   . Esophageal cancer Neg Hx   . Inflammatory bowel disease Neg Hx   . Liver disease Neg Hx   . Pancreatic cancer Neg Hx   . Rectal cancer Neg Hx   . Stomach cancer Neg Hx    Past Surgical History:  Procedure Laterality Date  . NO PAST SURGERIES     Social History    Social History Narrative  . Not on file   Immunization History  Administered Date(s) Administered  . Influenza,inj,Quad PF,6+ Mos 01/16/2020  . PFIZER SARS-COV-2 Vaccination 08/07/2019, 09/06/2019     Objective: Vital Signs: There were no vitals taken for this visit.   Physical Exam   Musculoskeletal Exam: ***  CDAI Exam: CDAI Score: -- Patient Global: --; Provider Global: -- Swollen: --; Tender: -- Joint Exam 02/27/2020   No joint exam has been documented for this visit   There is currently no information documented on the homunculus. Go to the Rheumatology activity and complete the homunculus joint exam.  Investigation: No additional findings.  Imaging: No results found.  Recent Labs: Lab Results  Component Value Date   WBC 4.2 01/16/2020   HGB 14.5 01/16/2020   PLT 142 01/16/2020   NA 140 01/16/2020   K 3.8 01/16/2020   CL 104 01/16/2020   CO2 26 01/16/2020   GLUCOSE 80 01/16/2020   BUN 14 01/16/2020   CREATININE 0.96 01/16/2020   BILITOT 0.4 01/16/2020   ALKPHOS 56 03/10/2018   AST 30 01/16/2020   ALT 27 01/16/2020   PROT 6.8 01/16/2020   ALBUMIN 4.6 03/10/2018   CALCIUM 9.8 01/16/2020   GFRAA 141 09/26/2019   QFTBGOLDPLUS NEGATIVE 05/02/2019    Speciality Comments: No specialty comments available.  Procedures:  No procedures performed Allergies: Patient has no known allergies.   Assessment / Plan:     Visit Diagnoses: No diagnosis found.  Orders:  No orders of the defined types were placed in this encounter.  No orders of the defined types were placed in this encounter.   Face-to-face time spent with patient was *** minutes. Greater than 50% of time was spent in counseling and coordination of care.  Follow-Up Instructions: No follow-ups on file.   Earnestine Mealing, CMA  Note - This record has been created using Editor, commissioning.  Chart creation errors have been sought, but may not always  have been located. Such creation errors do  not reflect on  the standard of medical care.

## 2020-02-14 ENCOUNTER — Other Ambulatory Visit: Payer: Self-pay | Admitting: Rheumatology

## 2020-02-14 DIAGNOSIS — M458 Ankylosing spondylitis sacral and sacrococcygeal region: Secondary | ICD-10-CM

## 2020-02-14 NOTE — Telephone Encounter (Signed)
Last Visit: 09/26/2019 Next Visit: 02/27/2020 Labs: 01/16/2020 MCH 34.4, neutro abs 1,441 TB Gold: 05/02/2019 negative   Current Dose per office note on 09/26/2019: Enbrel Mini 50 mg every 7 days  DX:Ankylosing spondylitis of sacral region  I called the patient to see if he has been taking enbrel on schedule since we have not refilled since 09/04/2019. Patient states he has been taking on schedule.   Okay to refill enbrel?

## 2020-02-25 ENCOUNTER — Encounter: Payer: No Typology Code available for payment source | Admitting: Gastroenterology

## 2020-02-25 ENCOUNTER — Telehealth: Payer: Self-pay | Admitting: *Deleted

## 2020-02-25 NOTE — Telephone Encounter (Signed)
Patient contacted the office regarding FMLA paperwork. Patient has not discussed it with the providers. Patient advised he will need to make an appointment to discuss prior to [place] being able to fill it out. Patient is unavailable until the middle of January 2022. Patient has been scheduled for 03/24/2020 to discuss.

## 2020-02-27 ENCOUNTER — Ambulatory Visit: Payer: No Typology Code available for payment source | Admitting: Physician Assistant

## 2020-02-27 DIAGNOSIS — R748 Abnormal levels of other serum enzymes: Secondary | ICD-10-CM

## 2020-02-27 DIAGNOSIS — M722 Plantar fascial fibromatosis: Secondary | ICD-10-CM

## 2020-02-27 DIAGNOSIS — R1013 Epigastric pain: Secondary | ICD-10-CM

## 2020-02-27 DIAGNOSIS — F32A Depression, unspecified: Secondary | ICD-10-CM

## 2020-02-27 DIAGNOSIS — Z1589 Genetic susceptibility to other disease: Secondary | ICD-10-CM

## 2020-02-27 DIAGNOSIS — Z87898 Personal history of other specified conditions: Secondary | ICD-10-CM

## 2020-02-27 DIAGNOSIS — M458 Ankylosing spondylitis sacral and sacrococcygeal region: Secondary | ICD-10-CM

## 2020-02-27 DIAGNOSIS — M461 Sacroiliitis, not elsewhere classified: Secondary | ICD-10-CM

## 2020-02-27 DIAGNOSIS — Z79899 Other long term (current) drug therapy: Secondary | ICD-10-CM

## 2020-02-27 DIAGNOSIS — R1011 Right upper quadrant pain: Secondary | ICD-10-CM

## 2020-03-10 NOTE — Progress Notes (Deleted)
Office Visit Note  Patient: Carl Lewis             Date of Birth: 1995/10/27           MRN: [id number]             PCP: Philip Aspen, Limmie Patricia, MD Referring: Philip Aspen, [place]* Visit Date: 03/24/2020 Occupation: @GUAROCC @  Subjective:  No chief complaint on file.   History of Present Illness: Carl Lewis is a 25 y.o. male ***   Activities of Daily Living:  Patient reports morning stiffness for *** {minute/hour:19697}.   Patient {ACTIONS;DENIES/REPORTS:21021675::"Denies"} nocturnal pain.  Difficulty dressing/grooming: {ACTIONS;DENIES/REPORTS:21021675::"Denies"} Difficulty climbing stairs: {ACTIONS;DENIES/REPORTS:21021675::"Denies"} Difficulty getting out of chair: {ACTIONS;DENIES/REPORTS:21021675::"Denies"} Difficulty using hands for taps, buttons, cutlery, and/or writing: {ACTIONS;DENIES/REPORTS:21021675::"Denies"}  No Rheumatology ROS completed.   PMFS History:  Patient Active Problem List   Diagnosis Date Noted  . Vitamin D deficiency 01/17/2020  . Ankylosing spondylitis of thoracolumbar region ([facility]) 05/17/2018  . HLA B27 positive 05/17/2018  . Plantar fasciitis, bilateral 05/17/2018  . Sacroiliitis ([facility]) 05/17/2018  . Anxiety and depression 05/17/2018  . Alcohol abuse 05/17/2018  . Pain of upper abdomen 03/16/2018  . Right flank pain, chronic 03/16/2018  . Food intolerance in adult 03/16/2018    Past Medical History:  Diagnosis Date  . Ankylosing spondylitis ([facility])   . Anxiety and depression   . Sacroiliitis ([facility])     Family History  Problem Relation Age of Onset  . Asthma Mother   . Anxiety disorder Mother   . Arthritis Father   . Healthy Sister   . Colon cancer Neg Hx   . Esophageal cancer Neg Hx   . Inflammatory bowel disease Neg Hx   . Liver disease Neg Hx   . Pancreatic cancer Neg Hx   . Rectal cancer Neg Hx   . Stomach cancer Neg Hx    Past Surgical History:  Procedure Laterality Date  . NO PAST SURGERIES     Social History    Social History Narrative  . Not on file   Immunization History  Administered Date(s) Administered  . Influenza,inj,Quad PF,6+ Mos 01/16/2020  . PFIZER SARS-COV-2 Vaccination 08/07/2019, 09/06/2019     Objective: Vital Signs: There were no vitals taken for this visit.   Physical Exam   Musculoskeletal Exam: ***  CDAI Exam: CDAI Score: -- Patient Global: --; Provider Global: -- Swollen: --; Tender: -- Joint Exam 03/24/2020   No joint exam has been documented for this visit   There is currently no information documented on the homunculus. Go to the Rheumatology activity and complete the homunculus joint exam.  Investigation: No additional findings.  Imaging: No results found.  Recent Labs: Lab Results  Component Value Date   WBC 4.2 01/16/2020   HGB 14.5 01/16/2020   PLT 142 01/16/2020   NA 140 01/16/2020   K 3.8 01/16/2020   CL 104 01/16/2020   CO2 26 01/16/2020   GLUCOSE 80 01/16/2020   BUN 14 01/16/2020   CREATININE 0.96 01/16/2020   BILITOT 0.4 01/16/2020   ALKPHOS 56 03/10/2018   AST 30 01/16/2020   ALT 27 01/16/2020   PROT 6.8 01/16/2020   ALBUMIN 4.6 03/10/2018   CALCIUM 9.8 01/16/2020   GFRAA 141 09/26/2019   QFTBGOLDPLUS NEGATIVE 05/02/2019    Speciality Comments: No specialty comments available.  Procedures:  No procedures performed Allergies: Patient has no known allergies.   Assessment / Plan:     Visit Diagnoses: Ankylosing spondylitis of sacral region [facility])  HLA B27 positive  High risk medication use  Plantar fasciitis, bilateral  Sacroiliitis (HCC)  History of alcohol use  Anxiety and depression  Elevated CK  Orders: No orders of the defined types were placed in this encounter.  No orders of the defined types were placed in this encounter.   Face-to-face time spent with patient was *** minutes. Greater than 50% of time was spent in counseling and coordination of care.  Follow-Up Instructions: No follow-ups on  file.   Ofilia Neas, PA-C  Note - This record has been created using Dragon software.  Chart creation errors have been sought, but may not always  have been located. Such creation errors do not reflect on  the standard of medical care.

## 2020-03-21 NOTE — Progress Notes (Deleted)
Office Visit Note  Patient: Carl Lewis             Date of Birth: September 11, 1995           MRN: [id number]             PCP: Carl Lewis, Carl Halsted, MD Referring: Carl Lewis, Carl Lewis* Visit Date: 03/28/2020 Occupation: @GUAROCC @  Subjective:  No chief complaint on file.   History of Present Illness: Carl Lewis is a 25 y.o. male ***   Activities of Daily Living:  Patient reports morning stiffness for *** {minute/hour:19697}.   Patient {ACTIONS;DENIES/REPORTS:21021675::"Denies"} nocturnal pain.  Difficulty dressing/grooming: {ACTIONS;DENIES/REPORTS:21021675::"Denies"} Difficulty climbing stairs: {ACTIONS;DENIES/REPORTS:21021675::"Denies"} Difficulty getting out of chair: {ACTIONS;DENIES/REPORTS:21021675::"Denies"} Difficulty using hands for taps, buttons, cutlery, and/or writing: {ACTIONS;DENIES/REPORTS:21021675::"Denies"}  No Rheumatology ROS completed.   PMFS History:  Patient Active Problem List   Diagnosis Date Noted  . Vitamin D deficiency 01/17/2020  . Ankylosing spondylitis of thoracolumbar region ([place]) 05/17/2018  . HLA B27 positive 05/17/2018  . Plantar fasciitis, bilateral 05/17/2018  . Sacroiliitis ([place]) 05/17/2018  . Anxiety and depression 05/17/2018  . Alcohol abuse 05/17/2018  . Pain of upper abdomen 03/16/2018  . Right flank pain, chronic 03/16/2018  . Food intolerance in adult 03/16/2018    Past Medical History:  Diagnosis Date  . Ankylosing spondylitis ([place])   . Anxiety and depression   . Sacroiliitis ([place])     Family History  Problem Relation Age of Onset  . Asthma Mother   . Anxiety disorder Mother   . Arthritis Father   . Healthy Sister   . Colon cancer Neg Hx   . Esophageal cancer Neg Hx   . Inflammatory bowel disease Neg Hx   . Liver disease Neg Hx   . Pancreatic cancer Neg Hx   . Rectal cancer Neg Hx   . Stomach cancer Neg Hx    Past Surgical History:  Procedure Laterality Date  . NO PAST SURGERIES     Social History    Social History Narrative  . Not on file   Immunization History  Administered Date(s) Administered  . Influenza,inj,Quad PF,6+ Mos 01/16/2020  . PFIZER SARS-COV-2 Vaccination 08/07/2019, 09/06/2019     Objective: Vital Signs: There were no vitals taken for this visit.   Physical Exam   Musculoskeletal Exam: ***  CDAI Exam: CDAI Score: -- Patient Global: --; Provider Global: -- Swollen: --; Tender: -- Joint Exam 03/28/2020   No joint exam has been documented for this visit   There is currently no information documented on the homunculus. Go to the Rheumatology activity and complete the homunculus joint exam.  Investigation: No additional findings.  Imaging: No results found.  Recent Labs: Lab Results  Component Value Date   WBC 4.2 01/16/2020   HGB 14.5 01/16/2020   PLT 142 01/16/2020   NA 140 01/16/2020   K 3.8 01/16/2020   CL 104 01/16/2020   CO2 26 01/16/2020   GLUCOSE 80 01/16/2020   BUN 14 01/16/2020   CREATININE 0.96 01/16/2020   BILITOT 0.4 01/16/2020   ALKPHOS 56 03/10/2018   AST 30 01/16/2020   ALT 27 01/16/2020   PROT 6.8 01/16/2020   ALBUMIN 4.6 03/10/2018   CALCIUM 9.8 01/16/2020   GFRAA 141 09/26/2019   QFTBGOLDPLUS NEGATIVE 05/02/2019    Speciality Comments: No specialty comments available.  Procedures:  No procedures performed Allergies: Patient has no known allergies.   Assessment / Plan:     Visit Diagnoses: No diagnosis found.  Orders: No  orders of the defined types were placed in this encounter.  No orders of the defined types were placed in this encounter.   Face-to-face time spent with patient was *** minutes. Greater than 50% of time was spent in counseling and coordination of care.  Follow-Up Instructions: No follow-ups on file.   Carl Lewis, CMA  Note - This record has been created using Editor, commissioning.  Chart creation errors have been sought, but may not always  have been located. Such creation errors do  not reflect on  the standard of medical care.

## 2020-03-24 ENCOUNTER — Ambulatory Visit: Payer: 59 | Admitting: Physician Assistant

## 2020-03-24 DIAGNOSIS — Z1589 Genetic susceptibility to other disease: Secondary | ICD-10-CM

## 2020-03-24 DIAGNOSIS — M722 Plantar fascial fibromatosis: Secondary | ICD-10-CM

## 2020-03-24 DIAGNOSIS — Z87898 Personal history of other specified conditions: Secondary | ICD-10-CM

## 2020-03-24 DIAGNOSIS — F419 Anxiety disorder, unspecified: Secondary | ICD-10-CM

## 2020-03-24 DIAGNOSIS — M461 Sacroiliitis, not elsewhere classified: Secondary | ICD-10-CM

## 2020-03-24 DIAGNOSIS — Z79899 Other long term (current) drug therapy: Secondary | ICD-10-CM

## 2020-03-24 DIAGNOSIS — R748 Abnormal levels of other serum enzymes: Secondary | ICD-10-CM

## 2020-03-24 DIAGNOSIS — M458 Ankylosing spondylitis sacral and sacrococcygeal region: Secondary | ICD-10-CM

## 2020-03-26 NOTE — Progress Notes (Deleted)
Office Visit Note  Patient: Carl Lewis             Date of Birth: 1995-06-11           MRN: [id number]             PCP: Isaac Bliss, Rayford Halsted, MD Referring: Isaac Bliss, [place]* Visit Date: 03/31/2020 Occupation: @GUAROCC @  Subjective:  No chief complaint on file.   History of Present Illness: Carl Lewis is a 25 y.o. male ***   Activities of Daily Living:  Patient reports morning stiffness for *** {minute/hour:19697}.   Patient {ACTIONS;DENIES/REPORTS:21021675::"Denies"} nocturnal pain.  Difficulty dressing/grooming: {ACTIONS;DENIES/REPORTS:21021675::"Denies"} Difficulty climbing stairs: {ACTIONS;DENIES/REPORTS:21021675::"Denies"} Difficulty getting out of chair: {ACTIONS;DENIES/REPORTS:21021675::"Denies"} Difficulty using hands for taps, buttons, cutlery, and/or writing: {ACTIONS;DENIES/REPORTS:21021675::"Denies"}  No Rheumatology ROS completed.   PMFS History:  Patient Active Problem List   Diagnosis Date Noted  . Vitamin D deficiency 01/17/2020  . Ankylosing spondylitis of thoracolumbar region ([place]) 05/17/2018  . HLA B27 positive 05/17/2018  . Plantar fasciitis, bilateral 05/17/2018  . Sacroiliitis ([place]) 05/17/2018  . Anxiety and depression 05/17/2018  . Alcohol abuse 05/17/2018  . Pain of upper abdomen 03/16/2018  . Right flank pain, chronic 03/16/2018  . Food intolerance in adult 03/16/2018    Past Medical History:  Diagnosis Date  . Ankylosing spondylitis ([place])   . Anxiety and depression   . Sacroiliitis ([place])     Family History  Problem Relation Age of Onset  . Asthma Mother   . Anxiety disorder Mother   . Arthritis Father   . Healthy Sister   . Colon cancer Neg Hx   . Esophageal cancer Neg Hx   . Inflammatory bowel disease Neg Hx   . Liver disease Neg Hx   . Pancreatic cancer Neg Hx   . Rectal cancer Neg Hx   . Stomach cancer Neg Hx    Past Surgical History:  Procedure Laterality Date  . NO PAST SURGERIES     Social History    Social History Narrative  . Not on file   Immunization History  Administered Date(s) Administered  . Influenza,inj,Quad PF,6+ Mos 01/16/2020  . PFIZER([place])SARS-COV-2 Vaccination 08/07/2019, 09/06/2019     Objective: Vital Signs: There were no vitals taken for this visit.   Physical Exam   Musculoskeletal Exam: ***  CDAI Exam: CDAI Score: -- Patient Global: --; Provider Global: -- Swollen: --; Tender: -- Joint Exam 03/31/2020   No joint exam has been documented for this visit   There is currently no information documented on the homunculus. Go to the Rheumatology activity and complete the homunculus joint exam.  Investigation: No additional findings.  Imaging: No results found.  Recent Labs: Lab Results  Component Value Date   WBC 4.2 01/16/2020   HGB 14.5 01/16/2020   PLT 142 01/16/2020   NA 140 01/16/2020   K 3.8 01/16/2020   CL 104 01/16/2020   CO2 26 01/16/2020   GLUCOSE 80 01/16/2020   BUN 14 01/16/2020   CREATININE 0.96 01/16/2020   BILITOT 0.4 01/16/2020   ALKPHOS 56 03/10/2018   AST 30 01/16/2020   ALT 27 01/16/2020   PROT 6.8 01/16/2020   ALBUMIN 4.6 03/10/2018   CALCIUM 9.8 01/16/2020   GFRAA 141 09/26/2019   QFTBGOLDPLUS NEGATIVE 05/02/2019    Speciality Comments: No specialty comments available.  Procedures:  No procedures performed Allergies: Patient has no known allergies.   Assessment / Plan:     Visit Diagnoses: No diagnosis found.  Orders: No  orders of the defined types were placed in this encounter.  No orders of the defined types were placed in this encounter.   Face-to-face time spent with patient was *** minutes. Greater than 50% of time was spent in counseling and coordination of care.  Follow-Up Instructions: No follow-ups on file.   Earnestine Mealing, CMA  Note - This record has been created using Editor, commissioning.  Chart creation errors have been sought, but may not always  have been located. Such  creation errors do not reflect on  the standard of medical care.

## 2020-03-28 ENCOUNTER — Ambulatory Visit: Payer: 59 | Admitting: Physician Assistant

## 2020-03-28 DIAGNOSIS — Z1589 Genetic susceptibility to other disease: Secondary | ICD-10-CM

## 2020-03-28 DIAGNOSIS — R1011 Right upper quadrant pain: Secondary | ICD-10-CM

## 2020-03-28 DIAGNOSIS — R748 Abnormal levels of other serum enzymes: Secondary | ICD-10-CM

## 2020-03-28 DIAGNOSIS — M461 Sacroiliitis, not elsewhere classified: Secondary | ICD-10-CM

## 2020-03-28 DIAGNOSIS — F32A Depression, unspecified: Secondary | ICD-10-CM

## 2020-03-28 DIAGNOSIS — Z87898 Personal history of other specified conditions: Secondary | ICD-10-CM

## 2020-03-28 DIAGNOSIS — M458 Ankylosing spondylitis sacral and sacrococcygeal region: Secondary | ICD-10-CM

## 2020-03-28 DIAGNOSIS — M722 Plantar fascial fibromatosis: Secondary | ICD-10-CM

## 2020-03-28 DIAGNOSIS — Z79899 Other long term (current) drug therapy: Secondary | ICD-10-CM

## 2020-03-28 DIAGNOSIS — R1013 Epigastric pain: Secondary | ICD-10-CM

## 2020-03-31 ENCOUNTER — Ambulatory Visit: Payer: 59 | Admitting: Physician Assistant

## 2020-03-31 DIAGNOSIS — R1011 Right upper quadrant pain: Secondary | ICD-10-CM

## 2020-03-31 DIAGNOSIS — Z1589 Genetic susceptibility to other disease: Secondary | ICD-10-CM

## 2020-03-31 DIAGNOSIS — F419 Anxiety disorder, unspecified: Secondary | ICD-10-CM

## 2020-03-31 DIAGNOSIS — M722 Plantar fascial fibromatosis: Secondary | ICD-10-CM

## 2020-03-31 DIAGNOSIS — M461 Sacroiliitis, not elsewhere classified: Secondary | ICD-10-CM

## 2020-03-31 DIAGNOSIS — R748 Abnormal levels of other serum enzymes: Secondary | ICD-10-CM

## 2020-03-31 DIAGNOSIS — Z87898 Personal history of other specified conditions: Secondary | ICD-10-CM

## 2020-03-31 DIAGNOSIS — M458 Ankylosing spondylitis sacral and sacrococcygeal region: Secondary | ICD-10-CM

## 2020-03-31 DIAGNOSIS — R1013 Epigastric pain: Secondary | ICD-10-CM

## 2020-03-31 DIAGNOSIS — Z79899 Other long term (current) drug therapy: Secondary | ICD-10-CM

## 2020-03-31 NOTE — Progress Notes (Signed)
Office Visit Note  Patient: Carl Lewis             Date of Birth: [birth year]           MRN: [id number]             PCP: Philip Aspen, Limmie Patricia, MD Referring: Philip Aspen, Almira Bar* Visit Date: 04/02/2020 Occupation: @GUAROCC @  Subjective:  Medication monitoring   History of Present Illness: Carl Lewis is a 25 y.o. male with history of ankylosing spondylitis.  He is on enbrel 50 mg sq injections once weekly. He has not missed any doses recently.  He denies any recent flares.  He has not been experiencing any increased joint pain or joint swelling.  He has no plantar fasciitis or Achilles tendinitis at this time. He denies any lower back pain or stiffness.  He denies any nocturnal pain.  He denies any eye pain, photophobia, or redness.  He has not had any recent rashes.  He reports that he continues to follow-up with Dr. 22 for ongoing right upper quadrant pain.  He states that he has undergone a thorough work-up including an endoscopy, ultrasound, and abdominal CT which have been inconclusive. He denies any recent infections.  He would like Orvan Falconer to complete FMLA paperwork for his job in order for him to cover him for frequent lab monitoring and office visits.    Activities of Daily Living:  Patient reports morning stiffness for 0 minutes.   Patient Reports nocturnal pain.  Difficulty dressing/grooming: Denies Difficulty climbing stairs: Denies Difficulty getting out of chair: Denies Difficulty using hands for taps, buttons, cutlery, and/or writing: Denies  Review of Systems  Constitutional: Positive for fatigue. Negative for night sweats.  HENT: Negative for mouth sores, mouth dryness and nose dryness.   Eyes: Positive for redness. Negative for pain and dryness.  Respiratory: Negative for shortness of breath and difficulty breathing.   Cardiovascular: Negative for chest pain, palpitations, hypertension, irregular heartbeat and swelling in legs/feet.  Gastrointestinal:  Negative for constipation and diarrhea.  Endocrine: Negative for increased urination.  Genitourinary: Negative for painful urination.  Musculoskeletal: Negative for arthralgias, joint pain, joint swelling, myalgias, muscle weakness, morning stiffness, muscle tenderness and myalgias.  Skin: Negative for color change, rash, hair loss, nodules/bumps, skin tightness, ulcers and sensitivity to sunlight.  Allergic/Immunologic: Negative for susceptible to infections.  Neurological: Positive for dizziness and headaches. Negative for fainting and night sweats.  Hematological: Negative for swollen glands.  Psychiatric/Behavioral: Negative for depressed mood and sleep disturbance. The patient is not nervous/anxious.     PMFS History:  Patient Active Problem List   Diagnosis Date Noted  . Vitamin D deficiency 01/17/2020  . Ankylosing spondylitis of thoracolumbar region (HCC) 05/17/2018  . HLA B27 positive 05/17/2018  . Plantar fasciitis, bilateral 05/17/2018  . Sacroiliitis (HCC) 05/17/2018  . Anxiety and depression 05/17/2018  . Alcohol abuse 05/17/2018  . Pain of upper abdomen 03/16/2018  . Right flank pain, chronic 03/16/2018  . Food intolerance in adult 03/16/2018    Past Medical History:  Diagnosis Date  . Ankylosing spondylitis (HCC)   . Anxiety and depression   . Sacroiliitis (HCC)     Family History  Problem Relation Age of Onset  . Asthma Mother   . Anxiety disorder Mother   . Arthritis Father   . Healthy Sister   . Colon cancer Neg Hx   . Esophageal cancer Neg Hx   . Inflammatory bowel disease Neg Hx   . Liver disease Neg  Hx   . Pancreatic cancer Neg Hx   . Rectal cancer Neg Hx   . Stomach cancer Neg Hx    Past Surgical History:  Procedure Laterality Date  . NO PAST SURGERIES     Social History   Social History Narrative  . Not on file   Immunization History  Administered Date(s) Administered  . Influenza,inj,Quad PF,6+ Mos 01/16/2020  . PFIZER(Purple  Top)SARS-COV-2 Vaccination 08/07/2019, 09/06/2019, 03/21/2020     Objective: Vital Signs: BP 118/74 (BP Location: Right Arm, Patient Position: Sitting, Cuff Size: Normal)   Pulse 73   Resp 16   Ht $R'5\' 10"'NM$  (1.778 m)   Wt 150 lb (68 kg)   BMI 21.52 kg/m    Physical Exam Vitals and nursing note reviewed.  Constitutional:      Appearance: He is well-developed and well-nourished.  HENT:     Head: Normocephalic and atraumatic.  Eyes:     Extraocular Movements: EOM normal.     Conjunctiva/sclera: Conjunctivae normal.     Pupils: Pupils are equal, round, and reactive to light.  Pulmonary:     Effort: Pulmonary effort is normal.  Abdominal:     Palpations: Abdomen is soft.  Musculoskeletal:     Cervical back: Normal range of motion and neck supple.  Skin:    General: Skin is warm and dry.     Capillary Refill: Capillary refill takes less than 2 seconds.  Neurological:     Mental Status: He is alert and oriented to person, place, and time.  Psychiatric:        Mood and Affect: Mood and affect normal.        Behavior: Behavior normal.      Musculoskeletal Exam: C-spine, thoracic spine, and lumbar spine good ROM.  Shoulder joints, elbow joints, wrist joints, MCPs, PIPs, and DIPs good ROM with no synovitis.  Complete fist formation bilaterally.  Hip joints good ROM with no discomfort.  Knee joints good ROM with no discomfort.  Ankle joints good ROM with no tenderness or inflammation.   CDAI Exam: CDAI Score: -- Patient Global: --; Provider Global: -- Swollen: --; Tender: -- Joint Exam 04/02/2020   No joint exam has been documented for this visit   There is currently no information documented on the homunculus. Go to the Rheumatology activity and complete the homunculus joint exam.  Investigation: No additional findings.  Imaging: No results found.  Recent Labs: Lab Results  Component Value Date   WBC 4.2 01/16/2020   HGB 14.5 01/16/2020   PLT 142 01/16/2020   NA 140  01/16/2020   K 3.8 01/16/2020   CL 104 01/16/2020   CO2 26 01/16/2020   GLUCOSE 80 01/16/2020   BUN 14 01/16/2020   CREATININE 0.96 01/16/2020   BILITOT 0.4 01/16/2020   ALKPHOS 56 03/10/2018   AST 30 01/16/2020   ALT 27 01/16/2020   PROT 6.8 01/16/2020   ALBUMIN 4.6 03/10/2018   CALCIUM 9.8 01/16/2020   GFRAA 141 09/26/2019   QFTBGOLDPLUS NEGATIVE 05/02/2019    Speciality Comments: No specialty comments available.  Procedures:  No procedures performed Allergies: Patient has no known allergies.   Assessment / Plan:     Visit Diagnoses: Ankylosing spondylitis of sacral region [facility]): He has not had any signs or symptoms of a flare recently. C-spine, thoracic and lumbar spine have good ROM with no discomfort. No joint tenderness or synovitis on exam.  He has no achilles tendonitis or plantar fascitis currently.  He is  clinically doing well on Enbrel 50 mg sq injections once weekly.  He has not missed any doses recently.  He has not been experiencing any nocturnal pain or morning stiffness.  He has no difficulty with ADLs.  He has not had any symptoms of eye inflammation or recent rashes.  He has ongoing RUQ pain x1 year and has undergone a thorough workup by Dr. Tarri Glenn. LFTs and Alk phos remain WNL.  According to the patient his discomfort typically improves after his weekly enbrel injections and is exacerbated by alcohol and sugar intake.  He has undergone a endoscopy but has not had a colonoscopy yet. Denies family history of IBD but a colonoscopy should be considered due to intermittent rectal bleeding and alleviation of symptoms after weekly enbrel dosing.  He will continue to follow up with Dr. Tarri Glenn, and he was encouraged to have future results faxed to our office.   He will continue on enbrel 50 mg sq injections once weekly.  He does not need a refill at this time.  He was advised to notify us if he develops increased joint pain or joint swelling.  He will follow up in 5 months.    HLA B27 positive  High risk medication use - Enbrel 50 mg sq injections once weekly.  CBC and CMP were drawn on 01/16/2020.  He will be due to update lab work in February and every 3 months to monitor for drug toxicity.  Standing orders for CBC and CMP were placed today.  TB gold negative on 05/02/2019 and will continue to be monitored yearly.  Future order for TB gold was placed today. He has received 3 Pfizer COVID-19 vaccinations. He has not had any recent infections.  He was advised to hold Enbrel if he develops signs or symptoms of an infection and to resume once the infection has completely cleared. - Plan: QuantiFERON-TB Gold Plus, CBC with Differential/Platelet, COMPLETE METABOLIC PANEL WITH GFR We will complete FMLA paperwork to cover him for frequent lab monitoring and office visits.   Screening for tuberculosis -Future order for TB gold placed today.  Plan: QuantiFERON-TB Gold Plus  Plantar fasciitis, bilateral: Resolved. He is wearing proper fitting shoes.   Sacroiliitis [facility]): He has no SI joint pain or stiffness at this time. No nocturnal pain.  Chronic RUQ pain: He has been experiencing intermittent RUQ pain for over 1 year. His symptoms are alleviated by weekly enbrel injections and are aggravated by alcohol use and sugar intolerance. He has not been experiencing any nausea, vomiting, constipation, or diarrhea. He has occasional rectal bleeding, which he reports is due to hemorrhoids.  LFTs have remained WNL-most recent 01/16/20: AST 30 and ALT 27. Alk phos remains WNL. He has been following along with Dr. Tarri Glenn closely, and he was also evaluated by a hepatologist at [facility] as a second opinion. He has undergone a thorough workup including an ultrasound, EGD, and abdominal CT, and MRI/MRCP.  He has no family history of IBD, but his symptoms raise the concern for IBD related abdominal pain, especially since his discomfort is alleviated by use of Enbrel.  Discussed that he may require a  colonoscopy for further evaluation of occasional rectal bleeding and ongoing RUQ pain.  Other medical conditions are listed as follows:    History of alcohol use  Anxiety and depression  Elevated CK: CK WNL 113 on 05/04/18.    Orders: Orders Placed This Encounter  Procedures  . QuantiFERON-TB Gold Plus  . CBC with Differential/Platelet  .  COMPLETE METABOLIC PANEL WITH GFR   No orders of the defined types were placed in this encounter.   Follow-Up Instructions: Return in about 5 months (around 08/31/2020) for Ankylosing Spondylitis.   Ofilia Neas, PA-C  Note - This record has been created using Dragon software.  Chart creation errors have been sought, but may not always  have been located. Such creation errors do not reflect on  the standard of medical care.

## 2020-04-02 ENCOUNTER — Telehealth: Payer: Self-pay | Admitting: *Deleted

## 2020-04-02 ENCOUNTER — Ambulatory Visit (INDEPENDENT_AMBULATORY_CARE_PROVIDER_SITE_OTHER): Payer: 59 | Admitting: Physician Assistant

## 2020-04-02 ENCOUNTER — Encounter: Payer: Self-pay | Admitting: Physician Assistant

## 2020-04-02 ENCOUNTER — Other Ambulatory Visit: Payer: Self-pay

## 2020-04-02 VITALS — BP 118/74 | HR 73 | Resp 16 | Ht 70.0 in | Wt 150.0 lb

## 2020-04-02 DIAGNOSIS — Z1589 Genetic susceptibility to other disease: Secondary | ICD-10-CM | POA: Diagnosis not present

## 2020-04-02 DIAGNOSIS — M722 Plantar fascial fibromatosis: Secondary | ICD-10-CM

## 2020-04-02 DIAGNOSIS — Z79899 Other long term (current) drug therapy: Secondary | ICD-10-CM

## 2020-04-02 DIAGNOSIS — F419 Anxiety disorder, unspecified: Secondary | ICD-10-CM

## 2020-04-02 DIAGNOSIS — F32A Depression, unspecified: Secondary | ICD-10-CM

## 2020-04-02 DIAGNOSIS — M458 Ankylosing spondylitis sacral and sacrococcygeal region: Secondary | ICD-10-CM

## 2020-04-02 DIAGNOSIS — G8929 Other chronic pain: Secondary | ICD-10-CM

## 2020-04-02 DIAGNOSIS — R748 Abnormal levels of other serum enzymes: Secondary | ICD-10-CM

## 2020-04-02 DIAGNOSIS — Z111 Encounter for screening for respiratory tuberculosis: Secondary | ICD-10-CM

## 2020-04-02 DIAGNOSIS — M461 Sacroiliitis, not elsewhere classified: Secondary | ICD-10-CM

## 2020-04-02 DIAGNOSIS — Z87898 Personal history of other specified conditions: Secondary | ICD-10-CM

## 2020-04-02 DIAGNOSIS — R1011 Right upper quadrant pain: Secondary | ICD-10-CM

## 2020-04-02 NOTE — Patient Instructions (Signed)
Standing Labs We placed an order today for your standing lab work.   Please have your standing labs drawn in February and every 3 months    If possible, please have your labs drawn 2 weeks prior to your appointment so that the provider can discuss your results at your appointment.  We have open lab daily Monday through Thursday from 8:30-12:30 PM and 1:30-4:30 PM and Friday from 8:30-12:30 PM and 1:30-4:00 PM at the office of Dr. Shaili Deveshwar, [facility].   Please be advised, patients with office appointments requiring lab work will take precedents over walk-in lab work.  If possible, please come for your lab work on Monday and Friday afternoons, as you may experience shorter wait times. The office is located at [address], [address], [place], [place] [address] No appointment is necessary.   Labs are drawn by [facility]. Please bring your co-pay at the time of your lab draw.  You may receive a bill from [facility] for your lab work.  If you wish to have your labs drawn at another location, please call the office 24 hours in advance to send orders.  If you have any questions regarding directions or hours of operation,  please call [phone].   As a reminder, please drink plenty of water prior to coming for your lab work. Thanks!   

## 2020-04-02 NOTE — Telephone Encounter (Signed)
Please call patient to schedule f/u appt. Thank you.  Follow-Up Instructions: Return in about 5 months (around 08/31/2020) for Ankylosing Spondylitis.

## 2020-04-03 ENCOUNTER — Telehealth: Payer: Self-pay | Admitting: *Deleted

## 2020-04-03 NOTE — Telephone Encounter (Signed)
Patient advised we have completed his FMLA paperwork and have faxed it. Patient advised we have placed a copy of the paperwork at the front desk for him to pick up. Patient expressed understanding.

## 2020-04-04 ENCOUNTER — Other Ambulatory Visit: Payer: Self-pay | Admitting: Internal Medicine

## 2020-04-04 DIAGNOSIS — E559 Vitamin D deficiency, unspecified: Secondary | ICD-10-CM

## 2020-04-11 ENCOUNTER — Other Ambulatory Visit: Payer: Self-pay | Admitting: Gastroenterology

## 2020-04-21 ENCOUNTER — Other Ambulatory Visit: Payer: Self-pay | Admitting: Gastroenterology

## 2020-04-21 DIAGNOSIS — E739 Lactose intolerance, unspecified: Secondary | ICD-10-CM

## 2020-04-21 DIAGNOSIS — R1011 Right upper quadrant pain: Secondary | ICD-10-CM

## 2020-04-24 ENCOUNTER — Other Ambulatory Visit: Payer: Self-pay | Admitting: Gastroenterology

## 2020-04-24 DIAGNOSIS — E739 Lactose intolerance, unspecified: Secondary | ICD-10-CM

## 2020-04-24 DIAGNOSIS — R1011 Right upper quadrant pain: Secondary | ICD-10-CM

## 2020-05-19 ENCOUNTER — Other Ambulatory Visit: Payer: Self-pay

## 2020-05-19 ENCOUNTER — Other Ambulatory Visit: Payer: Self-pay | Admitting: *Deleted

## 2020-05-19 DIAGNOSIS — Z79899 Other long term (current) drug therapy: Secondary | ICD-10-CM

## 2020-05-19 DIAGNOSIS — Z111 Encounter for screening for respiratory tuberculosis: Secondary | ICD-10-CM

## 2020-05-20 NOTE — Progress Notes (Signed)
ALT is within the desirable range.  Albumin is a type of protein and is only borderline elevated.   MCH is only borderline elevated and has improved.  MCH looks at the size of RBCs.  Labs are not consistent with macrocytic anemia at this time.    If he would like to have lab work rechecked he can return in 1 month or follow up with PCP for further evaluation.

## 2020-05-20 NOTE — Progress Notes (Signed)
Albumin is borderline elevated.  Total protein WNL.  Rest of CMP WNL.  MCH is borderline elevated.  Rest of CBC WNL.

## 2020-05-21 LAB — CBC WITH DIFFERENTIAL/PLATELET
Absolute Monocytes: 333 cells/uL (ref 200–950)
Basophils Absolute: 50 cells/uL (ref 0–200)
Basophils Relative: 1.1 %
Eosinophils Absolute: 122 cells/uL (ref 15–500)
Eosinophils Relative: 2.7 %
HCT: 43.7 % (ref 38.5–50.0)
Hemoglobin: 15.1 g/dL (ref 13.2–17.1)
Lymphs Abs: 2363 cells/uL (ref 850–3900)
MCH: 33.6 pg — ABNORMAL HIGH (ref 27.0–33.0)
MCHC: 34.6 g/dL (ref 32.0–36.0)
MCV: 97.1 fL (ref 80.0–100.0)
MPV: 11 fL (ref 7.5–12.5)
Monocytes Relative: 7.4 %
Neutro Abs: 1634 cells/uL (ref 1500–7800)
Neutrophils Relative %: 36.3 %
Platelets: 207 10*3/uL (ref 140–400)
RBC: 4.5 10*6/uL (ref 4.20–5.80)
RDW: 12.1 % (ref 11.0–15.0)
Total Lymphocyte: 52.5 %
WBC: 4.5 10*3/uL (ref 3.8–10.8)

## 2020-05-21 LAB — COMPLETE METABOLIC PANEL WITH GFR
AG Ratio: 2.3 (calc) (ref 1.0–2.5)
ALT: 9 U/L (ref 9–46)
AST: 19 U/L (ref 10–40)
Albumin: 5.2 g/dL — ABNORMAL HIGH (ref 3.6–5.1)
Alkaline phosphatase (APISO): 53 U/L (ref 36–130)
BUN: 14 mg/dL (ref 7–25)
CO2: 27 mmol/L (ref 20–32)
Calcium: 10.3 mg/dL (ref 8.6–10.3)
Chloride: 103 mmol/L (ref 98–110)
Creat: 0.96 mg/dL (ref 0.60–1.35)
GFR, Est African American: 127 mL/min/{1.73_m2} (ref >=60)
GFR, Est Non African American: 109 mL/min/{1.73_m2} (ref >=60)
Globulin: 2.3 g/dL (calc) (ref 1.9–3.7)
Glucose, Bld: 76 mg/dL (ref 65–99)
Potassium: 4 mmol/L (ref 3.5–5.3)
Sodium: 140 mmol/L (ref 135–146)
Total Bilirubin: 0.8 mg/dL (ref 0.2–1.2)
Total Protein: 7.5 g/dL (ref 6.1–8.1)

## 2020-05-21 LAB — QUANTIFERON-TB GOLD PLUS
Mitogen-NIL: >10 IU/mL
NIL: 0.03 IU/mL
QuantiFERON-TB Gold Plus: NEGATIVE
TB1-NIL: 0.01 IU/mL
TB2-NIL: 0.01 IU/mL

## 2020-05-21 NOTE — Progress Notes (Signed)
TB gold negative

## 2020-05-30 ENCOUNTER — Telehealth: Payer: Self-pay

## 2020-05-30 NOTE — Telephone Encounter (Signed)
Patient called stating the battery in his Enbrel auto injector is not working.  Patient states he called the specialty pharmacy and was told a fax would be sent to our office to be completed for replacement.  Patient requested a return call.

## 2020-05-30 NOTE — Telephone Encounter (Signed)
FYI - patient stated someone already called him.

## 2020-06-02 ENCOUNTER — Telehealth: Payer: Self-pay

## 2020-06-02 NOTE — Telephone Encounter (Signed)
Returned call and placed verbal order via voicemail to replace Autotouch Injector device on pharmacist line 

## 2020-06-02 NOTE — Telephone Encounter (Signed)
Returned call and placed verbal order via voicemail to replace Autotouch Injector device on pharmacist line

## 2020-06-02 NOTE — Telephone Encounter (Signed)
Nikki from [employer] left a voicemail requesting verbal order for a product replacmenent.  Patient has reported an enbrel autotouch device defective to the manufacturer.  [employer] will replace the device free of charge and we can ship it to him.  We do need a prescription to dispense.  Please call [phone]  Case #[id number] [facility]

## 2020-06-13 ENCOUNTER — Telehealth: Payer: Self-pay | Admitting: Internal Medicine

## 2020-06-13 NOTE — Telephone Encounter (Signed)
Patient is requesting a call back--he is interested in getting the HPV Vaccines and a Tetanus/Tdap.

## 2020-06-13 NOTE — Telephone Encounter (Signed)
Okay to schedule

## 2020-06-13 NOTE — Telephone Encounter (Signed)
Nurse visit scheduled.

## 2020-06-13 NOTE — Telephone Encounter (Signed)
Yes, nurse visit

## 2020-06-20 ENCOUNTER — Other Ambulatory Visit: Payer: Self-pay | Admitting: Internal Medicine

## 2020-06-20 DIAGNOSIS — E559 Vitamin D deficiency, unspecified: Secondary | ICD-10-CM

## 2020-06-24 ENCOUNTER — Ambulatory Visit: Payer: Self-pay

## 2020-07-01 ENCOUNTER — Ambulatory Visit (INDEPENDENT_AMBULATORY_CARE_PROVIDER_SITE_OTHER): Payer: 59 | Admitting: *Deleted

## 2020-07-01 ENCOUNTER — Other Ambulatory Visit: Payer: Self-pay

## 2020-07-01 DIAGNOSIS — Z23 Encounter for immunization: Secondary | ICD-10-CM

## 2020-07-11 ENCOUNTER — Encounter (INDEPENDENT_AMBULATORY_CARE_PROVIDER_SITE_OTHER): Payer: Self-pay

## 2020-07-23 ENCOUNTER — Other Ambulatory Visit: Payer: Self-pay | Admitting: Gastroenterology

## 2020-07-23 DIAGNOSIS — R1031 Right lower quadrant pain: Secondary | ICD-10-CM

## 2020-08-06 ENCOUNTER — Other Ambulatory Visit: Payer: Self-pay | Admitting: Internal Medicine

## 2020-08-06 ENCOUNTER — Other Ambulatory Visit: Payer: Self-pay | Admitting: Allergy

## 2020-08-06 DIAGNOSIS — E559 Vitamin D deficiency, unspecified: Secondary | ICD-10-CM

## 2020-08-18 NOTE — Progress Notes (Deleted)
Office Visit Note  Patient: Carl Lewis             Date of Birth: 05-Feb-1996           MRN: [id number]             PCP: Carl Lewis, Carl Patricia, MD Referring: Carl Lewis, Almira Bar* Visit Date: 09/01/2020 Occupation: @GUAROCC @  Subjective:  No chief complaint on file.   History of Present Illness: Carl Lewis is a 25 y.o. male ***   Activities of Daily Living:  Patient reports morning stiffness for *** {minute/hour:19697}.   Patient {ACTIONS;DENIES/REPORTS:21021675::"Denies"} nocturnal pain.  Difficulty dressing/grooming: {ACTIONS;DENIES/REPORTS:21021675::"Denies"} Difficulty climbing stairs: {ACTIONS;DENIES/REPORTS:21021675::"Denies"} Difficulty getting out of chair: {ACTIONS;DENIES/REPORTS:21021675::"Denies"} Difficulty using hands for taps, buttons, cutlery, and/or writing: {ACTIONS;DENIES/REPORTS:21021675::"Denies"}  No Rheumatology ROS completed.   PMFS History:  Patient Active Problem List   Diagnosis Date Noted   Vitamin D deficiency 01/17/2020   Ankylosing spondylitis of thoracolumbar region (HCC) 05/17/2018   HLA B27 positive 05/17/2018   Plantar fasciitis, bilateral 05/17/2018   Sacroiliitis (HCC) 05/17/2018   Anxiety and depression 05/17/2018   Alcohol abuse 05/17/2018   Pain of upper abdomen 03/16/2018   Right flank pain, chronic 03/16/2018   Food intolerance in adult 03/16/2018    Past Medical History:  Diagnosis Date   Ankylosing spondylitis (HCC)    Anxiety and depression    Sacroiliitis (HCC)     Family History  Problem Relation Age of Onset   Asthma Mother    Anxiety disorder Mother    Arthritis Father    Healthy Sister    Colon cancer Neg Hx    Esophageal cancer Neg Hx    Inflammatory bowel disease Neg Hx    Liver disease Neg Hx    Pancreatic cancer Neg Hx    Rectal cancer Neg Hx    Stomach cancer Neg Hx    Past Surgical History:  Procedure Laterality Date   NO PAST SURGERIES     Social History   Social History Narrative    Not on file   Immunization History  Administered Date(s) Administered   HPV 9-valent 07/01/2020   Influenza,inj,Quad PF,6+ Mos 01/16/2020   PFIZER(Purple Top)SARS-COV-2 Vaccination 08/07/2019, 09/06/2019, 03/21/2020   Tdap 07/01/2020     Objective: Vital Signs: There were no vitals taken for this visit.   Physical Exam   Musculoskeletal Exam:   CDAI Exam: CDAI Score: -- Patient Global: --; Provider Global: -- Swollen: --; Tender: -- Joint Exam 09/01/2020   No joint exam has been documented for this visit   There is currently no information documented on the homunculus. Go to the Rheumatology activity and complete the homunculus joint exam.  Investigation: No additional findings.  Imaging: No results found.  Recent Labs: Lab Results  Component Value Date   WBC 4.5 05/19/2020   HGB 15.1 05/19/2020   PLT 207 05/19/2020   NA 140 05/19/2020   K 4.0 05/19/2020   CL 103 05/19/2020   CO2 27 05/19/2020   GLUCOSE 76 05/19/2020   BUN 14 05/19/2020   CREATININE 0.96 05/19/2020   BILITOT 0.8 05/19/2020   ALKPHOS 56 03/10/2018   AST 19 05/19/2020   ALT 9 05/19/2020   PROT 7.5 05/19/2020   ALBUMIN 4.6 03/10/2018   CALCIUM 10.3 05/19/2020   GFRAA 127 05/19/2020   QFTBGOLDPLUS NEGATIVE 05/19/2020    Speciality Comments: No specialty comments available.  Procedures:  No procedures performed Allergies: Patient has no known allergies.   Assessment / Plan:  Visit Diagnoses: No diagnosis found.  Orders: No orders of the defined types were placed in this encounter.  No orders of the defined types were placed in this encounter.   Face-to-face time spent with patient was *** minutes. Greater than 50% of time was spent in counseling and coordination of care.  Follow-Up Instructions: No follow-ups on file.   Earnestine Mealing, CMA  Note - This record has been created using Editor, commissioning.  Chart creation errors have been sought, but may not always  have been  located. Such creation errors do not reflect on  the standard of medical care.

## 2020-08-20 ENCOUNTER — Other Ambulatory Visit: Payer: Self-pay | Admitting: Physician Assistant

## 2020-08-20 DIAGNOSIS — M458 Ankylosing spondylitis sacral and sacrococcygeal region: Secondary | ICD-10-CM

## 2020-08-20 NOTE — Telephone Encounter (Signed)
Next Visit: 09/01/2020  Last Visit: 04/02/2020  Last Fill: 02/14/2020  DX:Ankylosing spondylitis of sacral region  Current Dose per office note 04/02/2020: Enbrel 50 mg sq injections once weekly  Labs: 05/19/2020 ALT is within the desirable range.  Albumin is a type of protein and is only borderline elevated.   MCH is only borderline elevated and has improved.  MCH looks at the size ofRBCs.    TB Gold: 05/19/2020 Neg    Patient to update labs at upcoming appointment.   Okay to refill Enbrel?

## 2020-09-01 ENCOUNTER — Ambulatory Visit: Payer: 59 | Admitting: Rheumatology

## 2020-09-03 NOTE — Progress Notes (Signed)
Office Visit Note  Patient: Carl Lewis             Date of Birth: Sep 17, 1995           MRN: [id number]             PCP: Philip Aspen, Limmie Patricia, MD Referring: Philip Aspen, [place]* Visit Date: 09/05/2020 Occupation: @GUAROCC @  Subjective:  Medication monitoring  History of Present Illness: Carl Lewis is a 25 y.o. male with history of ankylosing spondylitis.  Patient is on Enbrel 50 mg subcutaneous injections once weekly.  He has not missed any doses of Enbrel recently.  He is tolerating Enbrel without any side effects or injection site reactions.  He denies any signs or symptoms of a flare.  He has not been experiencing any morning stiffness or nocturnal pain.  He continues to exercise on a regular basis without difficulty.  He denies any lower back pain at this time.  He still has difficulties touching his toes.  He denies any joint swelling.  He has not had any Achilles tendinitis or plantar fasciitis. He denies any recent infections.    Activities of Daily Living: Patient reports morning stiffness for 0 minutes.   Patient Denies nocturnal pain.  Difficulty dressing/grooming: Denies Difficulty climbing stairs: Denies Difficulty getting out of chair: Denies Difficulty using hands for taps, buttons, cutlery, and/or writing: Denies  Review of Systems  Constitutional:  Positive for fatigue.  HENT:  Negative for mouth sores, mouth dryness and nose dryness.   Eyes:  Positive for visual disturbance. Negative for pain, itching and dryness.  Respiratory:  Negative for cough, hemoptysis, shortness of breath and difficulty breathing.   Cardiovascular:  Negative for chest pain, palpitations and swelling in legs/feet.  Gastrointestinal:  Positive for abdominal pain. Negative for blood in stool, constipation and diarrhea.  Endocrine: Negative for increased urination.  Genitourinary:  Negative for painful urination.  Musculoskeletal:  Positive for joint pain and joint pain. Negative  for joint swelling, myalgias, muscle weakness, morning stiffness, muscle tenderness and myalgias.  Skin:  Negative for color change, rash and redness.  Allergic/Immunologic: Negative for susceptible to infections.  Neurological:  Positive for dizziness and weakness. Negative for numbness, headaches and memory loss.  Hematological:  Negative for swollen glands.  Psychiatric/Behavioral:  Negative for confusion and sleep disturbance.    PMFS History:  Patient Active Problem List   Diagnosis Date Noted   Vitamin D deficiency 01/17/2020   Ankylosing spondylitis of thoracolumbar region (HCC) 05/17/2018   HLA B27 positive 05/17/2018   Plantar fasciitis, bilateral 05/17/2018   Sacroiliitis (HCC) 05/17/2018   Anxiety and depression 05/17/2018   Alcohol abuse 05/17/2018   Pain of upper abdomen 03/16/2018   Right flank pain, chronic 03/16/2018   Food intolerance in adult 03/16/2018    Past Medical History:  Diagnosis Date   Ankylosing spondylitis (HCC)    Anxiety and depression    Sacroiliitis (HCC)     Family History  Problem Relation Age of Onset   Asthma Mother    Anxiety disorder Mother    Arthritis Father    Healthy Sister    Colon cancer Neg Hx    Esophageal cancer Neg Hx    Inflammatory bowel disease Neg Hx    Liver disease Neg Hx    Pancreatic cancer Neg Hx    Rectal cancer Neg Hx    Stomach cancer Neg Hx    Past Surgical History:  Procedure Laterality Date   NO PAST SURGERIES  Social History   Social History Narrative   Not on file   Immunization History  Administered Date(s) Administered   HPV 9-valent 07/01/2020   Influenza,inj,Quad PF,6+ Mos 01/16/2020   PFIZER(Purple Top)SARS-COV-2 Vaccination 08/07/2019, 09/06/2019, 03/21/2020   Tdap 07/01/2020     Objective: Vital Signs: BP 106/68 (BP Location: Left Arm, Patient Position: Sitting, Cuff Size: Normal)   Pulse 80   Ht $R'5\' 10"'Ij$  (1.778 m)   Wt 142 lb 9.6 oz (64.7 kg)   BMI 20.46 kg/m    Physical  Exam Vitals and nursing note reviewed.  Constitutional:      Appearance: He is well-developed.  HENT:     Head: Normocephalic and atraumatic.  Eyes:     Conjunctiva/sclera: Conjunctivae normal.     Pupils: Pupils are equal, round, and reactive to light.  Pulmonary:     Effort: Pulmonary effort is normal.  Abdominal:     Palpations: Abdomen is soft.  Musculoskeletal:     Cervical back: Normal range of motion and neck supple.  Skin:    General: Skin is warm and dry.     Capillary Refill: Capillary refill takes less than 2 seconds.  Neurological:     Mental Status: He is alert and oriented to person, place, and time.  Psychiatric:        Behavior: Behavior normal.     Musculoskeletal Exam: C-spine has good range of motion with no discomfort.  Limited range of motion of thoracic and lumbar spine.  Difficulty touching his toes.  No midline spinal tenderness or SI joint tenderness noted. Shoulder joints, elbow joints, wrist joints, MCPs, PIPs, and DIPs have good range of motion with no synovitis.  He was able to make a complete fist bilaterally.  Hip joints have good range of motion with no discomfort.  Knee joints have good range of motion with no warmth or effusion.  Ankle joints have good range of motion with no tenderness or joint swelling.  No evidence of Achilles tendinitis.  CDAI Exam: CDAI Score: -- Patient Global: --; Provider Global: -- Swollen: --; Tender: -- Joint Exam 09/05/2020   No joint exam has been documented for this visit   There is currently no information documented on the homunculus. Go to the Rheumatology activity and complete the homunculus joint exam.  Investigation: No additional findings.  Imaging: No results found.  Recent Labs: Lab Results  Component Value Date   WBC 4.5 05/19/2020   HGB 15.1 05/19/2020   PLT 207 05/19/2020   NA 140 05/19/2020   K 4.0 05/19/2020   CL 103 05/19/2020   CO2 27 05/19/2020   GLUCOSE 76 05/19/2020   BUN 14  05/19/2020   CREATININE 0.96 05/19/2020   BILITOT 0.8 05/19/2020   ALKPHOS 56 03/10/2018   AST 19 05/19/2020   ALT 9 05/19/2020   PROT 7.5 05/19/2020   ALBUMIN 4.6 03/10/2018   CALCIUM 10.3 05/19/2020   GFRAA 127 05/19/2020   QFTBGOLDPLUS NEGATIVE 05/19/2020    Speciality Comments: No specialty comments available.  Procedures:  No procedures performed Allergies: Patient has no known allergies.   Assessment / Plan:     Visit Diagnoses: Ankylosing spondylitis of sacral region [facility]): He has not had any signs or symptoms of a flare recently.  He has no synovitis or dactylitis on examination today.  No evidence of Achilles tendinitis or plantar fasciitis.  C-spine has good range of motion with no discomfort.  He has no midline spinal tenderness or SI joint tenderness.  He has not had any morning stiffness or nocturnal pain.  No difficulty with ADLs.  He has been exercising on a regular basis without difficulty.  Overall he is clinically doing well on Enbrel 50 mg subcutaneous injections once weekly.  He has been tolerating Enbrel without any side effects or injection site reactions.  He has not missed any doses of Enbrel recently.  He will continue on Enbrel as prescribed.  He was advised to notify us if he develops increased joint pain or joint swelling.  He will follow-up in the office in 5 months.  HLA B27 positive  High risk medication use - Enbrel 50 mg sq injections once weekly.  CBC and CMP were drawn on 05/19/2020.  He is due to update lab work.  Orders for CBC and CMP were released.  His next lab work will be due in October and every 3 months to monitor for drug toxicity.  Standing orders for CBC and CMP remain in place.  TB Gold negative on 05/19/2020 and will continue to be monitored yearly.  Plan: CBC with Differential/Platelet, COMPLETE METABOLIC PANEL WITH GFR  Plantar fasciitis, bilateral - Resolved.   Sacroiliitis [facility]): He has no SI joint tenderness or discomfort at this time.   He has not been experiencing any nocturnal pain or morning stiffness.  Chronic RUQ pain - Intermittent RUQ pain for over 1 year. His symptoms are alleviated by weekly enbrel injections and are aggravated by alcohol use and sugar intolerance.  Followed by GI.  He plans on starting to take probiotics on a daily basis.  Anxiety and depression  Elevated CK - CK WNL 113 on 05/04/18.   History of alcohol use  Orders: Orders Placed This Encounter  Procedures   CBC with Differential/Platelet   COMPLETE METABOLIC PANEL WITH GFR   No orders of the defined types were placed in this encounter.     Follow-Up Instructions: Return in about 5 months (around 02/05/2021) for Ankylosing Spondylitis.   Ofilia Neas, PA-C  Note - This record has been created using Dragon software.  Chart creation errors have been sought, but may not always  have been located. Such creation errors do not reflect on  the standard of medical care.

## 2020-09-05 ENCOUNTER — Ambulatory Visit (INDEPENDENT_AMBULATORY_CARE_PROVIDER_SITE_OTHER): Payer: 59 | Admitting: Physician Assistant

## 2020-09-05 ENCOUNTER — Other Ambulatory Visit: Payer: Self-pay

## 2020-09-05 ENCOUNTER — Encounter: Payer: Self-pay | Admitting: Physician Assistant

## 2020-09-05 VITALS — BP 106/68 | HR 80 | Ht 70.0 in | Wt 142.6 lb

## 2020-09-05 DIAGNOSIS — Z79899 Other long term (current) drug therapy: Secondary | ICD-10-CM | POA: Diagnosis not present

## 2020-09-05 DIAGNOSIS — F419 Anxiety disorder, unspecified: Secondary | ICD-10-CM

## 2020-09-05 DIAGNOSIS — M458 Ankylosing spondylitis sacral and sacrococcygeal region: Secondary | ICD-10-CM | POA: Diagnosis not present

## 2020-09-05 DIAGNOSIS — Z1589 Genetic susceptibility to other disease: Secondary | ICD-10-CM | POA: Diagnosis not present

## 2020-09-05 DIAGNOSIS — R748 Abnormal levels of other serum enzymes: Secondary | ICD-10-CM

## 2020-09-05 DIAGNOSIS — Z87898 Personal history of other specified conditions: Secondary | ICD-10-CM

## 2020-09-05 DIAGNOSIS — F32A Depression, unspecified: Secondary | ICD-10-CM

## 2020-09-05 DIAGNOSIS — G8929 Other chronic pain: Secondary | ICD-10-CM

## 2020-09-05 DIAGNOSIS — R1011 Right upper quadrant pain: Secondary | ICD-10-CM

## 2020-09-05 DIAGNOSIS — M722 Plantar fascial fibromatosis: Secondary | ICD-10-CM | POA: Diagnosis not present

## 2020-09-05 DIAGNOSIS — M461 Sacroiliitis, not elsewhere classified: Secondary | ICD-10-CM

## 2020-09-06 LAB — CBC WITH DIFFERENTIAL/PLATELET
Absolute Monocytes: 251 cells/uL (ref 200–950)
Basophils Absolute: 40 cells/uL (ref 0–200)
Basophils Relative: 1.3 %
Eosinophils Absolute: 180 cells/uL (ref 15–500)
Eosinophils Relative: 5.8 %
HCT: 43.2 % (ref 38.5–50.0)
Hemoglobin: 14.6 g/dL (ref 13.2–17.1)
Lymphs Abs: 1621 cells/uL (ref 850–3900)
MCH: 33 pg (ref 27.0–33.0)
MCHC: 33.8 g/dL (ref 32.0–36.0)
MCV: 97.7 fL (ref 80.0–100.0)
MPV: 10.6 fL (ref 7.5–12.5)
Monocytes Relative: 8.1 %
Neutro Abs: 1008 cells/uL — ABNORMAL LOW (ref 1500–7800)
Neutrophils Relative %: 32.5 %
Platelets: 200 10*3/uL (ref 140–400)
RBC: 4.42 10*6/uL (ref 4.20–5.80)
RDW: 11.8 % (ref 11.0–15.0)
Total Lymphocyte: 52.3 %
WBC: 3.1 10*3/uL — ABNORMAL LOW (ref 3.8–10.8)

## 2020-09-06 LAB — COMPLETE METABOLIC PANEL WITH GFR
AG Ratio: 2.1 (calc) (ref 1.0–2.5)
ALT: 9 U/L (ref 9–46)
AST: 20 U/L (ref 10–40)
Albumin: 4.8 g/dL (ref 3.6–5.1)
Alkaline phosphatase (APISO): 48 U/L (ref 36–130)
BUN: 12 mg/dL (ref 7–25)
CO2: 27 mmol/L (ref 20–32)
Calcium: 10 mg/dL (ref 8.6–10.3)
Chloride: 104 mmol/L (ref 98–110)
Creat: 0.96 mg/dL (ref 0.60–1.35)
GFR, Est African American: 127 mL/min/{1.73_m2} (ref >=60)
GFR, Est Non African American: 109 mL/min/{1.73_m2} (ref >=60)
Globulin: 2.3 g/dL (calc) (ref 1.9–3.7)
Glucose, Bld: 78 mg/dL (ref 65–99)
Potassium: 3.9 mmol/L (ref 3.5–5.3)
Sodium: 141 mmol/L (ref 135–146)
Total Bilirubin: 1 mg/dL (ref 0.2–1.2)
Total Protein: 7.1 g/dL (ref 6.1–8.1)

## 2020-09-09 NOTE — Progress Notes (Signed)
WBC count is borderline low-3.1.  absolute neutrophils are sightly low. Rest of CBC WNL.  Reviewed lab work with Dr. Corliss Skains.  We recommend spacing the dose of enbrel to every 10 days.  Recheck CBC with diff in 1 month.  CMP is WNL.

## 2020-09-16 ENCOUNTER — Other Ambulatory Visit: Payer: Self-pay | Admitting: *Deleted

## 2020-09-16 DIAGNOSIS — M458 Ankylosing spondylitis sacral and sacrococcygeal region: Secondary | ICD-10-CM

## 2020-09-16 DIAGNOSIS — Z79899 Other long term (current) drug therapy: Secondary | ICD-10-CM

## 2020-09-16 MED ORDER — ENBREL MINI 50 MG/ML ~~LOC~~ SOCT: SUBCUTANEOUS | 0 refills | Status: DC

## 2020-09-16 NOTE — Telephone Encounter (Signed)
-----   Message from Gearldine Bienenstock, PA-C sent at 09/09/2020  4:28 PM EDT ----- WBC count is borderline low-3.1.  absolute neutrophils are sightly low. Rest of CBC WNL.  Reviewed lab work with Dr. Corliss Skains.  We recommend spacing the dose of enbrel to every 10 days.  Recheck CBC with diff in 1 month.  CMP is WNL.

## 2020-10-13 ENCOUNTER — Other Ambulatory Visit: Payer: Self-pay | Admitting: Physician Assistant

## 2020-10-13 DIAGNOSIS — M458 Ankylosing spondylitis sacral and sacrococcygeal region: Secondary | ICD-10-CM

## 2020-10-13 NOTE — Telephone Encounter (Signed)
Next Visit: due 02/2021  Last Visit: 09/05/2020  Last Fill: 09/16/2020  DX:Ankylosing spondylitis of sacral region  Current Dose per office note on 09/05/2020: Enbrel 50 mg sq injections once weekly.    Labs: 09/05/2020 WBC count is borderline low-3.1.  absolute neutrophils are sightly low. Rest of CBC WNL.  Reviewed lab work with Dr. Corliss Skains.  We recommend spacing the doseof enbrel to every 10 days.  Recheck CBC with diff in 1 month.  CMP is WNL.    TB Gold: 05/19/2020 negative    Attempted to contact patient and left message on machine to advise patient he is due to update CBC with diff and also schedule a follow up appointment.

## 2020-10-14 NOTE — Telephone Encounter (Signed)
Next Visit: due December 2022. Message sent to the front to schedule patient  Last Visit: 09/05/2020  Last Fill: 08/20/2020  DX:Ankylosing spondylitis of sacral region  Current Dose per lab note on 09/05/2020: We recommend spacing the doseof enbrel to every 10 day  Labs: 09/05/2020 WBC count is borderline low-3.1.  absolute neutrophils are sightly low. Rest of CBC WNL.  Reviewed lab work with Dr. Corliss Skains.  We recommend spacing the doseof enbrel to every 10 days.  Recheck CBC with diff in 1 month.   TB Gold: 05/19/2020   Left message to advise patient he is due to update labs   Okay to refill Enbrel?

## 2020-10-20 ENCOUNTER — Telehealth: Payer: Self-pay

## 2020-10-20 NOTE — Telephone Encounter (Signed)
Per lab note on 09/05/2020 "WBC count is borderline low-3.1.  absolute neutrophils are sightly low. Rest of CBC WNL.  Reviewed lab work with Dr. Corliss Skains.  We recommend spacing the dose of enbrel to every 10 days.  Recheck CBC with diff in 1 month.  CMP is WNL."    Patient states he did not space enbrel to every 10 days because he needs the medication on regular schedule. I advised the patient we need to recheck the CBC with diff in order to check his WBC. Patient verbalized understanding and states he will be in tomorrow afternoon for labs by 4:30pm.

## 2020-10-20 NOTE — Telephone Encounter (Signed)
Patient states he was returning Andrea's call.  Patient scheduled 5 month follow-up appointment with Ladona Ridgel on 02/19/21.  Patient was due to schedule with Dr. Corliss Skains, but is only available at 8:00 or 4:30 and there were no available appt times on her schedule.    Patient states he has decided not to change the dosage of his Enbrel to 10 days.  Patient states the return for labwork was only if he changed the dosage.

## 2020-10-29 ENCOUNTER — Other Ambulatory Visit: Payer: Self-pay | Admitting: *Deleted

## 2020-10-29 DIAGNOSIS — Z79899 Other long term (current) drug therapy: Secondary | ICD-10-CM

## 2020-10-29 LAB — CBC WITH DIFFERENTIAL/PLATELET
Absolute Monocytes: 619 cells/uL (ref 200–950)
Basophils Absolute: 62 cells/uL (ref 0–200)
Basophils Relative: 1.2 %
Eosinophils Absolute: 390 cells/uL (ref 15–500)
Eosinophils Relative: 7.5 %
HCT: 41.6 % (ref 38.5–50.0)
Hemoglobin: 14.2 g/dL (ref 13.2–17.1)
Lymphs Abs: 2111 cells/uL (ref 850–3900)
MCH: 33.6 pg — ABNORMAL HIGH (ref 27.0–33.0)
MCHC: 34.1 g/dL (ref 32.0–36.0)
MCV: 98.6 fL (ref 80.0–100.0)
MPV: 10.9 fL (ref 7.5–12.5)
Monocytes Relative: 11.9 %
Neutro Abs: 2018 cells/uL (ref 1500–7800)
Neutrophils Relative %: 38.8 %
Platelets: 189 10*3/uL (ref 140–400)
RBC: 4.22 10*6/uL (ref 4.20–5.80)
RDW: 11.9 % (ref 11.0–15.0)
Total Lymphocyte: 40.6 %
WBC: 5.2 10*3/uL (ref 3.8–10.8)

## 2020-10-30 NOTE — Progress Notes (Signed)
WBC count has returned to WNL.  MCH is borderline elevated but not of concern.  Rest of CBC WNL.  Continue current dose of Enbrel.

## 2020-11-05 ENCOUNTER — Other Ambulatory Visit: Payer: Self-pay | Admitting: Allergy

## 2020-11-24 ENCOUNTER — Other Ambulatory Visit: Payer: Self-pay | Admitting: Physician Assistant

## 2020-11-24 DIAGNOSIS — M458 Ankylosing spondylitis sacral and sacrococcygeal region: Secondary | ICD-10-CM

## 2020-11-24 NOTE — Telephone Encounter (Signed)
Next Visit: 02/19/2021  Last Visit: 09/05/2020  Last Fill: 10/14/2020   DX:Ankylosing spondylitis of sacral region   Labs: 10/29/2020 CBC WBC count has returned to WNL.  MCH is borderline elevated but not of concern.  Rest of CBC WNL.  Continue current dose of Enbrel. 09/05/2020 WBC count is borderline low-3.1.  absolute neutrophils are sightly low. Rest of CBC WNL.  Reviewed lab work with Dr. Corliss Skains.  Patient advised Dr. Corliss Skains recommends spacing the doseof enbrel to every 10 days. CMP is WNL.   TB Gold: 05/19/2020    Patient states he did not space enbrel to every 10 days because he needs the medication on regular schedule.  Okay to refill Enbrel and at which dose?

## 2020-11-24 NOTE — Telephone Encounter (Signed)
Ok to inject enbrel once weekly but he will require lab work in 1 month to recheck CBC with diff.

## 2021-02-05 NOTE — Progress Notes (Signed)
Office Visit Note  Patient: Carl Lewis             Date of Birth: [birth year]           MRN: [id number]             PCP: Isaac Bliss, Rayford Halsted, MD Referring: Isaac Bliss, [place]* Visit Date: 02/19/2021 Occupation: _0 @  Subjective:  Medication monitoring   History of Present Illness: Manford Crumpler is a 25 y.o. male with history of ankylosing spondylitis.  He is currently on enbrel 50 mg sq injections once weekly.  He is tolerating Enbrel without any side effects or injection site reactions.  He denies any recent infections.  He denies any signs or symptoms of a flare recently.  He denies any joint swelling.  He denies any increased SI joint pain.  He has not had any nocturnal pain, difficulty with ADLs, or morning stiffness.  He denies any Achilles tendinitis or plantar fasciitis.  He denies any eye pain, inflammation, or photophobia.  Activities of Daily Living:  Patient reports morning stiffness for 0 minutes.   Patient Denies nocturnal pain.  Difficulty dressing/grooming: Denies Difficulty climbing stairs: Denies Difficulty getting out of chair: Denies Difficulty using hands for taps, buttons, cutlery, and/or writing: Denies  Review of Systems  Constitutional:  Negative for fatigue.  HENT:  Negative for mouth sores, mouth dryness and nose dryness.   Eyes:  Positive for dryness. Negative for pain and itching.  Respiratory:  Negative for shortness of breath and difficulty breathing.   Cardiovascular:  Negative for chest pain and palpitations.  Gastrointestinal:  Negative for blood in stool, constipation and diarrhea.  Endocrine: Negative for increased urination.  Genitourinary:  Negative for difficulty urinating.  Musculoskeletal:  Negative for joint pain, joint pain, joint swelling, myalgias, morning stiffness, muscle tenderness and myalgias.  Skin:  Positive for rash. Negative for color change.  Allergic/Immunologic: Negative for susceptible to infections.   Neurological:  Negative for dizziness, numbness, headaches, memory loss and weakness.  Hematological:  Negative for bruising/bleeding tendency.  Psychiatric/Behavioral:  Negative for confusion.    PMFS History:  Patient Active Problem List   Diagnosis Date Noted   Vitamin D deficiency 01/17/2020   Ankylosing spondylitis of thoracolumbar region ([place]) 05/17/2018   HLA B27 positive 05/17/2018   Plantar fasciitis, bilateral 05/17/2018   Sacroiliitis ([place]) 05/17/2018   Anxiety and depression 05/17/2018   Alcohol abuse 05/17/2018   Pain of upper abdomen 03/16/2018   Right flank pain, chronic 03/16/2018   Food intolerance in adult 03/16/2018    Past Medical History:  Diagnosis Date   Ankylosing spondylitis ([place])    Anxiety and depression    Sacroiliitis ([place])     Family History  Problem Relation Age of Onset   Asthma Mother    Anxiety disorder Mother    Arthritis Father    Healthy Sister    Colon cancer Neg Hx    Esophageal cancer Neg Hx    Inflammatory bowel disease Neg Hx    Liver disease Neg Hx    Pancreatic cancer Neg Hx    Rectal cancer Neg Hx    Stomach cancer Neg Hx    Past Surgical History:  Procedure Laterality Date   NO PAST SURGERIES     Social History   Social History Narrative   Not on file   Immunization History  Administered Date(s) Administered   HPV 9-valent 07/01/2020   Influenza,inj,Quad PF,6+ Mos 01/16/2020   PFIZER(Purple Top)SARS-COV-2 Vaccination 08/07/2019, 09/06/2019,  03/21/2020   Tdap 07/01/2020     Objective: Vital Signs: BP 124/77 (BP Location: Right Arm, Patient Position: Sitting, Cuff Size: Normal)   Pulse 74   Ht _0  (1.778 m)   Wt 156 lb 9.6 oz (71 kg)   BMI 22.47 kg/m    Physical Exam Vitals and nursing note reviewed.  Constitutional:      Appearance: He is well-developed.  HENT:     Head: Normocephalic and atraumatic.  Eyes:     Conjunctiva/sclera: Conjunctivae normal.     Pupils: Pupils are equal, round, and  reactive to light.  Pulmonary:     Effort: Pulmonary effort is normal.  Abdominal:     Palpations: Abdomen is soft.  Musculoskeletal:     Cervical back: Normal range of motion and neck supple.  Skin:    General: Skin is warm and dry.     Capillary Refill: Capillary refill takes less than 2 seconds.  Neurological:     Mental Status: He is alert and oriented to person, place, and time.  Psychiatric:        Behavior: Behavior normal.     Musculoskeletal Exam: C-spine, thoracic spine, lumbar spine have good range of motion with no discomfort.  No midline spinal tenderness or SI joint tenderness.  Shoulder joints, elbow joints, wrist joints, MCPs, PIPs, DIPs have good range of motion with no synovitis.  He was able to make a complete fist bilaterally.  Hip joints have good range of motion with no groin pain.  Knee joints have good range of motion with no warmth or effusion.  Ankle joints have good range of motion with no tenderness or joint swelling.  No evidence of Achilles tendinitis or plantar fasciitis.  CDAI Exam: CDAI Score: -- Patient Global: --; Provider Global: -- Swollen: --; Tender: -- Joint Exam 02/19/2021   No joint exam has been documented for this visit   There is currently no information documented on the homunculus. Go to the Rheumatology activity and complete the homunculus joint exam.  Investigation: No additional findings.  Imaging: No results found.  Recent Labs: Lab Results  Component Value Date   WBC 5.2 10/29/2020   HGB 14.2 10/29/2020   PLT 189 10/29/2020   NA 141 09/05/2020   K 3.9 09/05/2020   CL 104 09/05/2020   CO2 27 09/05/2020   GLUCOSE 78 09/05/2020   BUN 12 09/05/2020   CREATININE 0.96 09/05/2020   BILITOT 1.0 09/05/2020   ALKPHOS 56 03/10/2018   AST 20 09/05/2020   ALT 9 09/05/2020   PROT 7.1 09/05/2020   ALBUMIN 4.6 03/10/2018   CALCIUM 10.0 09/05/2020   GFRAA 127 09/05/2020   QFTBGOLDPLUS NEGATIVE 05/19/2020    Speciality  Comments: No specialty comments available.  Procedures:  No procedures performed Allergies: Patient has no known allergies.   Assessment / Plan:     Visit Diagnoses: Ankylosing spondylitis of sacral region [facility]): He has not had any signs or symptoms of a flare.  He is clinically doing well on Enbrel 50 mg sq injections once weekly.  He is tolerating Enbrel without any side effects or injection site reactions.  He has not missed any doses of Enbrel recently.  He has good range of motion of the C-spine, thoracic spine, and lumbar spine with no discomfort.  No midline spinal tenderness or SI joint tenderness was noted.  He has no evidence of Achilles tendinitis or plantar fasciitis.  No synovitis or dactylitis were noted on exam.  He has not had any eye pain, photophobia, or conjunctival injection.  He has noticed a rash intermittently on both lower extremities.  Discussed the association of psoriasis with HLA-B27 positivity.  Referral to dermatology was placed today for further evaluation.  He will remain on Enbrel as prescribed.  He was advised to notify us if he develops signs or symptoms of a flare.  He will follow-up in the office in 5 months.  HLA B27 positive  High risk medication use - Enbrel 50 mg sq injections once weekly. CBC and CMP updated on 09/05/20.  CBC updated on 10/29/20.  He is due to update lab work today.  Orders for CBC and CMP released.  His next lab work will be due in March and every 3 months. TB gold negative on 05/19/20.  Future order placed today. - Plan: CBC with Differential/Platelet, COMPLETE METABOLIC PANEL WITH GFR He has not had any recent infections.  Discussed the importance of holding Enbrel if he develops signs or symptoms of an infection and to resume once the infection has completely cleared.   Discussed the importance of yearly skin examinations while on Enbrel due to the increased risk for nonmelanoma skin cancers.  Plantar fasciitis, bilateral - Resolved.    Sacroiliitis [facility]): He has no SI joint tenderness or stiffness at this time.  No nocturnal pain or morning stiffness.    Other medical conditions are listed as follows:  Chronic RUQ pain - His symptoms are alleviated by weekly enbrel injections and are aggravated by alcohol use and sugar intolerance.  Followed by GI.   Anxiety and depression  Elevated CK - CK WNL 113 on 05/04/18.   History of alcohol use  Rash and other nonspecific skin eruption - He has noticed a rash intermittently on the back of both lower legs.  The rash is itchy and scaly at times.  He has tried OTC topical agents.  A referral to derm was placed today.   Also discussed the importance of yearly skin exams while on enbrel due to the increased risk for skin cancer. Plan: Ambulatory referral to Dermatology  Orders: Orders Placed This Encounter  Procedures   CBC with Differential/Platelet   COMPLETE METABOLIC PANEL WITH GFR   Ambulatory referral to Dermatology   No orders of the defined types were placed in this encounter.     Follow-Up Instructions: Return in about 5 months (around 07/20/2021) for Ankylosing Spondylitis.   Ofilia Neas, PA-C  Note - This record has been created using Dragon software.  Chart creation errors have been sought, but may not always  have been located. Such creation errors do not reflect on  the standard of medical care.

## 2021-02-19 ENCOUNTER — Encounter: Payer: Self-pay | Admitting: Physician Assistant

## 2021-02-19 ENCOUNTER — Other Ambulatory Visit: Payer: Self-pay

## 2021-02-19 ENCOUNTER — Ambulatory Visit (INDEPENDENT_AMBULATORY_CARE_PROVIDER_SITE_OTHER): Payer: 59 | Admitting: Physician Assistant

## 2021-02-19 VITALS — BP 124/77 | HR 74 | Ht 70.0 in | Wt 156.6 lb

## 2021-02-19 DIAGNOSIS — Z79899 Other long term (current) drug therapy: Secondary | ICD-10-CM

## 2021-02-19 DIAGNOSIS — F419 Anxiety disorder, unspecified: Secondary | ICD-10-CM

## 2021-02-19 DIAGNOSIS — Z87898 Personal history of other specified conditions: Secondary | ICD-10-CM

## 2021-02-19 DIAGNOSIS — M461 Sacroiliitis, not elsewhere classified: Secondary | ICD-10-CM

## 2021-02-19 DIAGNOSIS — F32A Depression, unspecified: Secondary | ICD-10-CM

## 2021-02-19 DIAGNOSIS — Z1589 Genetic susceptibility to other disease: Secondary | ICD-10-CM | POA: Diagnosis not present

## 2021-02-19 DIAGNOSIS — G8929 Other chronic pain: Secondary | ICD-10-CM

## 2021-02-19 DIAGNOSIS — R1011 Right upper quadrant pain: Secondary | ICD-10-CM

## 2021-02-19 DIAGNOSIS — R748 Abnormal levels of other serum enzymes: Secondary | ICD-10-CM

## 2021-02-19 DIAGNOSIS — M458 Ankylosing spondylitis sacral and sacrococcygeal region: Secondary | ICD-10-CM

## 2021-02-19 DIAGNOSIS — M722 Plantar fascial fibromatosis: Secondary | ICD-10-CM | POA: Diagnosis not present

## 2021-02-19 DIAGNOSIS — R21 Rash and other nonspecific skin eruption: Secondary | ICD-10-CM

## 2021-02-19 NOTE — Patient Instructions (Signed)
Standing Labs °We placed an order today for your standing lab work.  ° °Please have your standing labs drawn in March and every 3 months  ° ° °If possible, please have your labs drawn 2 weeks prior to your appointment so that the provider can discuss your results at your appointment. ° °Please note that you may see your imaging and lab results in MyChart before we have reviewed them. °We may be awaiting multiple results to interpret others before contacting you. °Please allow our office up to 72 hours to thoroughly review all of the results before contacting the office for clarification of your results. ° °We have open lab daily: °Monday through Thursday from 1:30-4:30 PM and Friday from 1:30-4:00 PM °at the office of Dr. Shaili Deveshwar, [facility].   °Please be advised, all patients with office appointments requiring lab work will take precedent over walk-in lab work.  °If possible, please come for your lab work on Monday and Friday afternoons, as you may experience shorter wait times. °The office is located at [address], [address], [place], [place] [address] °No appointment is necessary.   °Labs are drawn by [facility]. Please bring your co-pay at the time of your lab draw.  You may receive a bill from [facility] for your lab work. ° °If you wish to have your labs drawn at another location, please call the office 24 hours in advance to send orders. ° °If you have any questions regarding directions or hours of operation,  °please call [phone].   °As a reminder, please drink plenty of water prior to coming for your lab work. Thanks! ° °

## 2021-02-20 LAB — CBC WITH DIFFERENTIAL/PLATELET
Absolute Monocytes: 392 cells/uL (ref 200–950)
Basophils Absolute: 41 cells/uL (ref 0–200)
Basophils Relative: 0.9 %
Eosinophils Absolute: 221 cells/uL (ref 15–500)
Eosinophils Relative: 4.9 %
HCT: 42 % (ref 38.5–50.0)
Hemoglobin: 14.8 g/dL (ref 13.2–17.1)
Lymphs Abs: 1823 cells/uL (ref 850–3900)
MCH: 33.9 pg — ABNORMAL HIGH (ref 27.0–33.0)
MCHC: 35.2 g/dL (ref 32.0–36.0)
MCV: 96.1 fL (ref 80.0–100.0)
MPV: 10.1 fL (ref 7.5–12.5)
Monocytes Relative: 8.7 %
Neutro Abs: 2025 cells/uL (ref 1500–7800)
Neutrophils Relative %: 45 %
Platelets: 221 10*3/uL (ref 140–400)
RBC: 4.37 10*6/uL (ref 4.20–5.80)
RDW: 11.8 % (ref 11.0–15.0)
Total Lymphocyte: 40.5 %
WBC: 4.5 10*3/uL (ref 3.8–10.8)

## 2021-02-20 LAB — COMPLETE METABOLIC PANEL WITH GFR
AG Ratio: 1.8 (calc) (ref 1.0–2.5)
ALT: 15 U/L (ref 9–46)
AST: 21 U/L (ref 10–40)
Albumin: 4.5 g/dL (ref 3.6–5.1)
Alkaline phosphatase (APISO): 52 U/L (ref 36–130)
BUN: 13 mg/dL (ref 7–25)
CO2: 30 mmol/L (ref 20–32)
Calcium: 9.5 mg/dL (ref 8.6–10.3)
Chloride: 104 mmol/L (ref 98–110)
Creat: 0.94 mg/dL (ref 0.60–1.24)
Globulin: 2.5 g/dL (calc) (ref 1.9–3.7)
Glucose, Bld: 78 mg/dL (ref 65–99)
Potassium: 4.1 mmol/L (ref 3.5–5.3)
Sodium: 140 mmol/L (ref 135–146)
Total Bilirubin: 0.6 mg/dL (ref 0.2–1.2)
Total Protein: 7 g/dL (ref 6.1–8.1)
eGFR: 115 mL/min/{1.73_m2} (ref >=60)

## 2021-02-20 NOTE — Progress Notes (Signed)
MCH is borderline elevated-33.9, which is not of concern. Rest of CBC WNL.  CMP WNL.

## 2021-03-25 ENCOUNTER — Telehealth: Payer: Self-pay | Admitting: Pharmacist

## 2021-03-25 NOTE — Telephone Encounter (Signed)
Submitted a Prior Authorization request to [facility] for ENBREL via CMM. Will update once we receive a response.  Key: [id number]  Also received fax from [employer] with clinical questions. Completed faxed form with office visit notes just to prevent delay in renewal  Fax: [phone]  Chesley Mires, PharmD, MPH, BCPS Clinical Pharmacist (Rheumatology and Pulmonology)

## 2021-03-25 NOTE — Telephone Encounter (Signed)
Received fax from [facility] that PA is not handled by them. Will await response from [employer].  Carl Lewis, PharmD, MPH, BCPS Clinical Pharmacist (Rheumatology and Pulmonology)

## 2021-03-26 ENCOUNTER — Other Ambulatory Visit (HOSPITAL_COMMUNITY): Payer: Self-pay

## 2021-03-26 NOTE — Telephone Encounter (Signed)
Received notification from [facility] regarding a prior authorization for ENBREL. Authorization has been APPROVED from 03/25/21 to 03/24/22.   Patient must continue to fill through [facility]: [phone]  Authorization # [id number] Phone # [phone]  Knox Saliva, PharmD, MPH, BCPS Clinical Pharmacist (Rheumatology and Pulmonology)

## 2021-04-23 ENCOUNTER — Encounter: Payer: Self-pay | Admitting: Internal Medicine

## 2021-04-23 ENCOUNTER — Ambulatory Visit: Payer: 59 | Admitting: Internal Medicine

## 2021-04-23 VITALS — BP 110/70 | HR 70 | Temp 97.8°F | Wt 158.8 lb

## 2021-04-23 DIAGNOSIS — Z3009 Encounter for other general counseling and advice on contraception: Secondary | ICD-10-CM

## 2021-04-23 NOTE — Progress Notes (Signed)
Established Patient Office Visit     This visit occurred during the SARS-CoV-2 public health emergency.  Safety protocols were in place, including screening questions prior to the visit, additional usage of staff PPE, and extensive cleaning of exam room while observing appropriate contact time as indicated for disinfecting solutions.    CC/Reason for Visit: Discuss vasectomy  HPI: Carl Lewis is a 26 y.o. male who is coming in today for the above mentioned reasons. Past Medical History is significant for: Ankylosing spondylitis followed by rheumatology on weekly Enbrel and depression that is well controlled on Lamictal and Wellbutrin.  He is here today wanting to discuss permanent loss of fertility.  He is requesting a vasectomy.  He states that his partner has had some side effects with birth control, he is not interested in ever having a child.  He does not think he will change his mind.  He states that if he does he will adopt.   Past Medical/Surgical History: Past Medical History:  Diagnosis Date   Ankylosing spondylitis ([place])    Anxiety and depression    Sacroiliitis ([place])     Past Surgical History:  Procedure Laterality Date   NO PAST SURGERIES      Social History:  reports that he has never smoked. He has never used smokeless tobacco. He reports current alcohol use. He reports current drug use. Drug: Marijuana.  Allergies: No Known Allergies  Family History:  Family History  Problem Relation Age of Onset   Asthma Mother    Anxiety disorder Mother    Arthritis Father    Healthy Sister    Colon cancer Neg Hx    Esophageal cancer Neg Hx    Inflammatory bowel disease Neg Hx    Liver disease Neg Hx    Pancreatic cancer Neg Hx    Rectal cancer Neg Hx    Stomach cancer Neg Hx      Current Outpatient Medications:    Azelastine-Fluticasone 137-50 MCG/ACT SUSP, PLACE 1 SPRAY INTO THE NOSE IN THE MORNING AND AT BEDTIME., Disp: 23 g, Rfl: 0   buPROPion  (WELLBUTRIN SR) 100 MG 12 hr tablet, Take 100 mg by mouth daily., Disp: , Rfl:    cetirizine (ZYRTEC) 10 MG tablet, Take 10 mg by mouth as needed., Disp: , Rfl:    ENBREL MINI 50 MG/ML SOCT, Inject 50 mg into the skin once a week., Disp: 12 mL, Rfl: 0   ibuprofen (ADVIL,MOTRIN) 200 MG tablet, Take 200 mg by mouth every 6 (six) hours as needed for fever or mild pain., Disp: , Rfl:    lamoTRIgine (LAMICTAL) 100 MG tablet, , Disp: , Rfl:    Olopatadine HCl (PATADAY) 0.2 % SOLN, Place 1 drop into both eyes daily as needed., Disp: 2.5 mL, Rfl: 5   dicyclomine (BENTYL) 10 MG capsule, TAKE 2 CAPSULES (20 MG TOTAL) BY MOUTH 4 (FOUR) TIMES DAILY AS NEEDED FOR SPASMS. (Patient not taking: Reported on 02/19/2021), Disp: 540 capsule, Rfl: 1   omeprazole (PRILOSEC) 40 MG capsule, TAKE 1 CAPSULE (40 MG TOTAL) BY MOUTH IN THE MORNING AND AT BEDTIME. (Patient not taking: Reported on 04/23/2021), Disp: 180 capsule, Rfl: 1  Review of Systems:  Constitutional: Denies fever, chills, diaphoresis, appetite change and fatigue.  HEENT: Denies photophobia, eye pain, redness, hearing loss, ear pain, congestion, sore throat, rhinorrhea, sneezing, mouth sores, trouble swallowing, neck pain, neck stiffness and tinnitus.   Respiratory: Denies SOB, DOE, cough, chest tightness,  and wheezing.  Cardiovascular: Denies chest pain, palpitations and leg swelling.  Gastrointestinal: Denies nausea, vomiting, abdominal pain, diarrhea, constipation, blood in stool and abdominal distention.  Genitourinary: Denies dysuria, urgency, frequency, hematuria, flank pain and difficulty urinating.  Endocrine: Denies: hot or cold intolerance, sweats, changes in hair or nails, polyuria, polydipsia. Musculoskeletal: Denies myalgias, back pain, joint swelling, arthralgias and gait problem.  Skin: Denies pallor, rash and wound.  Neurological: Denies dizziness, seizures, syncope, weakness, light-headedness, numbness and headaches.  Hematological:  Denies adenopathy. Easy bruising, personal or family bleeding history  Psychiatric/Behavioral: Denies suicidal ideation, mood changes, confusion, nervousness, sleep disturbance and agitation    Physical Exam: Vitals:   04/23/21 0818  BP: 110/70  Pulse: 70  Temp: 97.8 F (36.6 C)  TempSrc: Oral  SpO2: 99%  Weight: 158 lb 12.8 oz (72 kg)    Body mass index is 22.79 kg/m.   Constitutional: NAD, calm, comfortable Eyes: PERRL, lids and conjunctivae normal ENMT: Mucous membranes are moist.  Respiratory: clear to auscultation bilaterally, no wheezing, no crackles. Normal respiratory effort. No accessory muscle use.  Cardiovascular: Regular rate and rhythm, no murmurs / rubs / gallops. No extremity edema.  Psychiatric: Normal judgment and insight. Alert and oriented x 3. Normal mood.    Impression and Plan:  Family planning counseling -Will send to urology to discuss a vasectomy. -He understands that this is likely a permanent procedure and would like to proceed regardless.  Time spent: 21 minutes reviewing chart, interviewing and examining patient and formulating plan of care.     Lelon Frohlich, MD [facility]

## 2021-05-12 ENCOUNTER — Other Ambulatory Visit: Payer: Self-pay | Admitting: Physician Assistant

## 2021-05-12 DIAGNOSIS — Z79899 Other long term (current) drug therapy: Secondary | ICD-10-CM

## 2021-05-12 DIAGNOSIS — Z9225 Personal history of immunosupression therapy: Secondary | ICD-10-CM

## 2021-05-12 DIAGNOSIS — Z111 Encounter for screening for respiratory tuberculosis: Secondary | ICD-10-CM

## 2021-05-12 DIAGNOSIS — M458 Ankylosing spondylitis sacral and sacrococcygeal region: Secondary | ICD-10-CM

## 2021-05-12 NOTE — Telephone Encounter (Signed)
Next Visit: 07/22/2021 ? ?Last Visit: 02/19/2021 ? ?Last Fill: 11/24/2020 ? ?UG:[id number] spondylitis of sacral region  ? ?Current Dose per office note 02/19/2021: Enbrel 50 mg sq injections once weekly ? ?Labs: 02/19/2021 MCH is borderline elevated-33.9, which is not of concern. Rest of CBC WNL.  CMP WNL.   ? ?TB Gold: 05/19/2020 Neg   ? ?Patient advised he is due to update labs. Patient states he will come in the week after next.  ? ?Okay to refill Enbrel?  ?

## 2021-05-13 ENCOUNTER — Telehealth: Payer: Self-pay | Admitting: Internal Medicine

## 2021-05-13 DIAGNOSIS — Z3009 Encounter for other general counseling and advice on contraception: Secondary | ICD-10-CM

## 2021-05-13 NOTE — Telephone Encounter (Signed)
Pt call and want a call back about a referral to a  Urologist ?

## 2021-05-14 NOTE — Telephone Encounter (Signed)
Referral has been placed. Patient is aware.  

## 2021-05-21 ENCOUNTER — Encounter: Payer: 59 | Admitting: Internal Medicine

## 2021-06-02 ENCOUNTER — Other Ambulatory Visit: Payer: Self-pay

## 2021-06-02 DIAGNOSIS — Z111 Encounter for screening for respiratory tuberculosis: Secondary | ICD-10-CM

## 2021-06-02 DIAGNOSIS — Z9225 Personal history of immunosupression therapy: Secondary | ICD-10-CM

## 2021-06-02 DIAGNOSIS — Z79899 Other long term (current) drug therapy: Secondary | ICD-10-CM

## 2021-06-03 NOTE — Progress Notes (Signed)
CBC and CMP WNL

## 2021-06-06 HISTORY — PX: VASECTOMY: SHX75

## 2021-06-06 LAB — CBC WITH DIFFERENTIAL/PLATELET
Absolute Monocytes: 432 cells/uL (ref 200–950)
Basophils Absolute: 52 cells/uL (ref 0–200)
Basophils Relative: 1.1 %
Eosinophils Absolute: 240 cells/uL (ref 15–500)
Eosinophils Relative: 5.1 %
HCT: 43 % (ref 38.5–50.0)
Hemoglobin: 14.9 g/dL (ref 13.2–17.1)
Lymphs Abs: 2063 cells/uL (ref 850–3900)
MCH: 33 pg (ref 27.0–33.0)
MCHC: 34.7 g/dL (ref 32.0–36.0)
MCV: 95.3 fL (ref 80.0–100.0)
MPV: 10.5 fL (ref 7.5–12.5)
Monocytes Relative: 9.2 %
Neutro Abs: 1913 cells/uL (ref 1500–7800)
Neutrophils Relative %: 40.7 %
Platelets: 226 10*3/uL (ref 140–400)
RBC: 4.51 10*6/uL (ref 4.20–5.80)
RDW: 12 % (ref 11.0–15.0)
Total Lymphocyte: 43.9 %
WBC: 4.7 10*3/uL (ref 3.8–10.8)

## 2021-06-06 LAB — QUANTIFERON-TB GOLD PLUS
Mitogen-NIL: >10 IU/mL
NIL: 0.03 IU/mL
QuantiFERON-TB Gold Plus: NEGATIVE
TB1-NIL: <0 IU/mL
TB2-NIL: 0 IU/mL

## 2021-06-06 LAB — COMPLETE METABOLIC PANEL WITH GFR
AG Ratio: 1.8 (calc) (ref 1.0–2.5)
ALT: 13 U/L (ref 9–46)
AST: 27 U/L (ref 10–40)
Albumin: 4.6 g/dL (ref 3.6–5.1)
Alkaline phosphatase (APISO): 51 U/L (ref 36–130)
BUN: 13 mg/dL (ref 7–25)
CO2: 27 mmol/L (ref 20–32)
Calcium: 9.5 mg/dL (ref 8.6–10.3)
Chloride: 104 mmol/L (ref 98–110)
Creat: 0.96 mg/dL (ref 0.60–1.24)
Globulin: 2.6 g/dL (calc) (ref 1.9–3.7)
Glucose, Bld: 82 mg/dL (ref 65–99)
Potassium: 4 mmol/L (ref 3.5–5.3)
Sodium: 141 mmol/L (ref 135–146)
Total Bilirubin: 0.6 mg/dL (ref 0.2–1.2)
Total Protein: 7.2 g/dL (ref 6.1–8.1)
eGFR: 112 mL/min/{1.73_m2} (ref >=60)

## 2021-06-08 NOTE — Progress Notes (Signed)
TB gold negative

## 2021-06-22 ENCOUNTER — Encounter: Payer: Self-pay | Admitting: Internal Medicine

## 2021-06-22 ENCOUNTER — Ambulatory Visit (INDEPENDENT_AMBULATORY_CARE_PROVIDER_SITE_OTHER): Payer: 59 | Admitting: Internal Medicine

## 2021-06-22 VITALS — BP 98/64 | HR 76 | Temp 97.6°F | Ht 70.5 in | Wt 161.1 lb

## 2021-06-22 DIAGNOSIS — Z0000 Encounter for general adult medical examination without abnormal findings: Secondary | ICD-10-CM

## 2021-06-22 DIAGNOSIS — Z1589 Genetic susceptibility to other disease: Secondary | ICD-10-CM | POA: Diagnosis not present

## 2021-06-22 DIAGNOSIS — M455 Ankylosing spondylitis of thoracolumbar region: Secondary | ICD-10-CM | POA: Diagnosis not present

## 2021-06-22 NOTE — Progress Notes (Signed)
? ? ? ?Established Patient Office Visit ? ? ? ? ?This visit occurred during the SARS-CoV-2 public health emergency.  Safety protocols were in place, including screening questions prior to the visit, additional usage of staff PPE, and extensive cleaning of exam room while observing appropriate contact time as indicated for disinfecting solutions.  ? ? ?CC/Reason for Visit: Annual preventive exam ? ?HPI: Gleb Gladish is a 26 y.o. male who is coming in today for the above mentioned reasons. Past Medical History is significant for: Ankylosing spondylitis followed by rheumatology as well as depression followed by psychiatry.  He is feeling well and has no acute concerns or complaints.  He is overdue for an eye exam, has routine dental care, exercises routinely.   He agrees to second immunization today. ? ? ?Past Medical/Surgical History: ?Past Medical History:  ?Diagnosis Date  ? Ankylosing spondylitis ([place])   ? Anxiety and depression   ? Sacroiliitis ([place])   ? ? ?Past Surgical History:  ?Procedure Laterality Date  ? NO PAST SURGERIES    ? ? ?Social History: ? reports that he has never smoked. He has never used smokeless tobacco. He reports current alcohol use. He reports current drug use. Drug: Marijuana. ? ?Allergies: ?No Known Allergies ? ?Family History:  ?Family History  ?Problem Relation Age of Onset  ? Asthma Mother   ? Anxiety disorder Mother   ? Arthritis Father   ? Healthy Sister   ? Colon cancer Neg Hx   ? Esophageal cancer Neg Hx   ? Inflammatory bowel disease Neg Hx   ? Liver disease Neg Hx   ? Pancreatic cancer Neg Hx   ? Rectal cancer Neg Hx   ? Stomach cancer Neg Hx   ? ? ? ?Current Outpatient Medications:  ?  Azelastine-Fluticasone 137-50 MCG/ACT SUSP, PLACE 1 SPRAY INTO THE NOSE IN THE MORNING AND AT BEDTIME., Disp: 23 g, Rfl: 0 ?  buPROPion (WELLBUTRIN SR) 100 MG 12 hr tablet, Take 100 mg by mouth daily., Disp: , Rfl:  ?  cetirizine (ZYRTEC) 10 MG tablet, Take 10 mg by mouth as needed., Disp: , Rfl:  ?   ENBREL MINI 50 MG/ML SOCT, INSERT 1 MINI CARTRIDGE INTO AUTOINJECTOR AND INJECT 50 MG UNDER THE SKIN EVERY 7 DAYS., Disp: 4 mL, Rfl: 0 ?  ibuprofen (ADVIL,MOTRIN) 200 MG tablet, Take 200 mg by mouth every 6 (six) hours as needed for fever or mild pain., Disp: , Rfl:  ?  lamoTRIgine (LAMICTAL) 100 MG tablet, , Disp: , Rfl:  ?  Olopatadine HCl (PATADAY) 0.2 % SOLN, Place 1 drop into both eyes daily as needed., Disp: 2.5 mL, Rfl: 5 ?  omeprazole (PRILOSEC) 40 MG capsule, TAKE 1 CAPSULE (40 MG TOTAL) BY MOUTH IN THE MORNING AND AT BEDTIME., Disp: 180 capsule, Rfl: 1 ? ?Review of Systems:  ?Constitutional: Denies fever, chills, diaphoresis, appetite change and fatigue.  ?HEENT: Denies photophobia, eye pain, redness, hearing loss, ear pain, congestion, sore throat, rhinorrhea, sneezing, mouth sores, trouble swallowing, neck pain, neck stiffness and tinnitus.   ?Respiratory: Denies SOB, DOE, cough, chest tightness,  and wheezing.   ?Cardiovascular: Denies chest pain, palpitations and leg swelling.  ?Gastrointestinal: Denies nausea, vomiting, abdominal pain, diarrhea, constipation, blood in stool and abdominal distention.  ?Genitourinary: Denies dysuria, urgency, frequency, hematuria, flank pain and difficulty urinating.  ?Endocrine: Denies: hot or cold intolerance, sweats, changes in hair or nails, polyuria, polydipsia. ?Musculoskeletal: Denies myalgias, back pain, joint swelling, arthralgias and gait problem.  ?Skin: Denies  pallor, rash and wound.  ?Neurological: Denies dizziness, seizures, syncope, weakness, light-headedness, numbness and headaches.  ?Hematological: Denies adenopathy. Easy bruising, personal or family bleeding history  ?Psychiatric/Behavioral: Denies suicidal ideation, mood changes, confusion, nervousness, sleep disturbance and agitation ? ? ? ?Physical Exam: ?Vitals:  ? 06/22/21 0813  ?BP: 98/64  ?Pulse: 76  ?Temp: 97.6 ?F (36.4 ?C)  ?TempSrc: Oral  ?SpO2: 99%  ?Weight: 161 lb 1.6 oz (73.1 kg)   ?Height: 5' 10.5" (1.791 m)  ? ? ?Body mass index is 22.79 kg/m?. ? ? ?Constitutional: NAD, calm, comfortable ?Eyes: PERRL, lids and conjunctivae normal ?ENMT: Mucous membranes are moist. Posterior pharynx clear of any exudate or lesions. Normal dentition. Tympanic membrane is pearly white, no erythema or bulging. ?Neck: normal, supple, no masses, no thyromegaly ?Respiratory: clear to auscultation bilaterally, no wheezing, no crackles. Normal respiratory effort. No accessory muscle use.  ?Cardiovascular: Regular rate and rhythm, no murmurs / rubs / gallops. No extremity edema. 2+ pedal pulses. No carotid bruits.  ?Abdomen: no tenderness, no masses palpated. No hepatosplenomegaly. Bowel sounds positive.  ?Musculoskeletal: no clubbing / cyanosis. No joint deformity upper and lower extremities. Good ROM, no contractures. Normal muscle tone.  ?Skin: no rashes, lesions, ulcers. No induration ?Neurologic: CN 2-12 grossly intact. Sensation intact, DTR normal. Strength 5/5 in all 4.  ?Psychiatric: Normal judgment and insight. Alert and oriented x 3. Normal mood.  ? ? ?Impression and Plan: ? ?Encounter for preventive health examination  ?- Plan: Ambulatory referral to Ophthalmology ?-Recommend routine eye and dental care. ?-Immunizations: All immunizations are up-to-date, including COVID and HPV.  He skipped his flu vaccine this year. ?-Healthy lifestyle discussed in detail. ?-Labs to be updated today. ?-Colon cancer screening: Commence age 56 ?-Breast cancer screening: Not applicable ?-Cervical cancer screening: Not applicable ?-Lung cancer screening: Not applicable ?-Prostate cancer screening: Not applicable ?-DEXA: Not applicable ? ?Ankylosing spondylitis of thoracolumbar region [facility]) ?HLA B27 positive ?-Followed by rheumatology ? ? ? ? ? ?Patient Instructions  ?-Nice seeing you today!! ? ?-See you back in 1 year or sooner as needed. ? ? ? ? ? ?Lelon Frohlich, MD ?[facility] ? ? ?

## 2021-06-22 NOTE — Patient Instructions (Signed)
-  Nice seeing you today!!  -See you back in 1 year or sooner as needed. 

## 2021-07-08 NOTE — Progress Notes (Deleted)
Office Visit Note  Patient: Carl Lewis             Date of Birth: 10-Jul-1995           MRN: [id number]             PCP: Isaac Bliss, Rayford Halsted, MD Referring: Isaac Bliss, [place]* Visit Date: 07/22/2021 Occupation: @GUAROCC @  Subjective:  No chief complaint on file.   History of Present Illness: Carl Lewis is a 26 y.o. male ***   Activities of Daily Living:  Patient reports morning stiffness for *** {minute/hour:19697}.   Patient {ACTIONS;DENIES/REPORTS:21021675::"Denies"} nocturnal pain.  Difficulty dressing/grooming: {ACTIONS;DENIES/REPORTS:21021675::"Denies"} Difficulty climbing stairs: {ACTIONS;DENIES/REPORTS:21021675::"Denies"} Difficulty getting out of chair: {ACTIONS;DENIES/REPORTS:21021675::"Denies"} Difficulty using hands for taps, buttons, cutlery, and/or writing: {ACTIONS;DENIES/REPORTS:21021675::"Denies"}  No Rheumatology ROS completed.   PMFS History:  Patient Active Problem List   Diagnosis Date Noted   Vitamin D deficiency 01/17/2020   Ankylosing spondylitis of thoracolumbar region ([place]) 05/17/2018   HLA B27 positive 05/17/2018   Plantar fasciitis, bilateral 05/17/2018   Sacroiliitis ([place]) 05/17/2018   Anxiety and depression 05/17/2018   Alcohol abuse 05/17/2018   Pain of upper abdomen 03/16/2018   Right flank pain, chronic 03/16/2018   Food intolerance in adult 03/16/2018    Past Medical History:  Diagnosis Date   Ankylosing spondylitis ([place])    Anxiety and depression    Sacroiliitis ([place])     Family History  Problem Relation Age of Onset   Asthma Mother    Anxiety disorder Mother    Arthritis Father    Healthy Sister    Colon cancer Neg Hx    Esophageal cancer Neg Hx    Inflammatory bowel disease Neg Hx    Liver disease Neg Hx    Pancreatic cancer Neg Hx    Rectal cancer Neg Hx    Stomach cancer Neg Hx    Past Surgical History:  Procedure Laterality Date   NO PAST SURGERIES     Social History   Social History Narrative    Not on file   Immunization History  Administered Date(s) Administered   HPV 9-valent 12/18/2010, 02/16/2013, 07/05/2013, 07/01/2020   Influenza,inj,Quad PF,6+ Mos 01/16/2020   PFIZER(Purple Top)SARS-COV-2 Vaccination 08/07/2019, 09/06/2019, 03/21/2020   Tdap 07/01/2020     Objective: Vital Signs: There were no vitals taken for this visit.   Physical Exam   Musculoskeletal Exam: ***  CDAI Exam: CDAI Score: -- Patient Global: --; Provider Global: -- Swollen: --; Tender: -- Joint Exam 07/22/2021   No joint exam has been documented for this visit   There is currently no information documented on the homunculus. Go to the Rheumatology activity and complete the homunculus joint exam.  Investigation: No additional findings.  Imaging: No results found.  Recent Labs: Lab Results  Component Value Date   WBC 4.7 06/02/2021   HGB 14.9 06/02/2021   PLT 226 06/02/2021   NA 141 06/02/2021   K 4.0 06/02/2021   CL 104 06/02/2021   CO2 27 06/02/2021   GLUCOSE 82 06/02/2021   BUN 13 06/02/2021   CREATININE 0.96 06/02/2021   BILITOT 0.6 06/02/2021   ALKPHOS 56 03/10/2018   AST 27 06/02/2021   ALT 13 06/02/2021   PROT 7.2 06/02/2021   ALBUMIN 4.6 03/10/2018   CALCIUM 9.5 06/02/2021   GFRAA 127 09/05/2020   QFTBGOLDPLUS NEGATIVE 06/02/2021    Speciality Comments: No specialty comments available.  Procedures:  No procedures performed Allergies: Patient has no known allergies.   Assessment /  Plan:     Visit Diagnoses: No diagnosis found.  Orders: No orders of the defined types were placed in this encounter.  No orders of the defined types were placed in this encounter.   Face-to-face time spent with patient was *** minutes. Greater than 50% of time was spent in counseling and coordination of care.  Follow-Up Instructions: No follow-ups on file.   Earnestine Mealing, CMA  Note - This record has been created using Editor, commissioning.  Chart creation errors have been  sought, but may not always  have been located. Such creation errors do not reflect on  the standard of medical care.

## 2021-07-21 ENCOUNTER — Other Ambulatory Visit: Payer: Self-pay | Admitting: Physician Assistant

## 2021-07-21 DIAGNOSIS — M458 Ankylosing spondylitis sacral and sacrococcygeal region: Secondary | ICD-10-CM

## 2021-07-21 NOTE — Telephone Encounter (Signed)
Next Visit: Due May 2023. Message sent to the front to schedule.  ? ?Last Visit: 02/19/2021 ? ?Last Fill: 05/12/2021 (30 day supply) ? ?[id number] spondylitis of sacral region  ? ?Current Dose per office note 02/19/2021: Enbrel 50 mg sq injections once weekly ? ?Labs: 06/02/2021 CBC and CMP WNL ? ?TB Gold: 06/02/2021 Neg   ? ?Okay to refill Enbrel?  ?

## 2021-07-21 NOTE — Telephone Encounter (Signed)
Patient states he has a conflict with his work schedule and is not able to take time off for an appointment at this time.  Patient states he will come in the office to have labwork in June.   ?

## 2021-07-21 NOTE — Telephone Encounter (Signed)
PLease schedule patient for a follow up visit. Patient due May 2023. Thanks!  ?

## 2021-07-22 ENCOUNTER — Ambulatory Visit: Payer: 59 | Admitting: Rheumatology

## 2021-07-22 DIAGNOSIS — R748 Abnormal levels of other serum enzymes: Secondary | ICD-10-CM

## 2021-07-22 DIAGNOSIS — F419 Anxiety disorder, unspecified: Secondary | ICD-10-CM

## 2021-07-22 DIAGNOSIS — M461 Sacroiliitis, not elsewhere classified: Secondary | ICD-10-CM

## 2021-07-22 DIAGNOSIS — M458 Ankylosing spondylitis sacral and sacrococcygeal region: Secondary | ICD-10-CM

## 2021-07-22 DIAGNOSIS — G8929 Other chronic pain: Secondary | ICD-10-CM

## 2021-07-22 DIAGNOSIS — Z1589 Genetic susceptibility to other disease: Secondary | ICD-10-CM

## 2021-07-22 DIAGNOSIS — R21 Rash and other nonspecific skin eruption: Secondary | ICD-10-CM

## 2021-07-22 DIAGNOSIS — Z79899 Other long term (current) drug therapy: Secondary | ICD-10-CM

## 2021-07-22 DIAGNOSIS — M722 Plantar fascial fibromatosis: Secondary | ICD-10-CM

## 2021-07-22 DIAGNOSIS — Z87898 Personal history of other specified conditions: Secondary | ICD-10-CM

## 2021-08-20 ENCOUNTER — Ambulatory Visit: Payer: 59 | Admitting: Allergy & Immunology

## 2022-04-02 ENCOUNTER — Telehealth: Payer: Self-pay | Admitting: Rheumatology

## 2022-04-02 NOTE — Telephone Encounter (Signed)
Kyung Rudd from [place] left a voicemail regarding patient's prescription of Enbrel.  The prescription does require a PA which we have initiated through [employer]. #[id number] Phone:  [phone]

## 2022-04-02 NOTE — Telephone Encounter (Signed)
Patient has not been seen since Dec 2022. Will not be renewing PA until patient is seen for followup. CMM key archived  Knox Saliva, PharmD, MPH, BCPS, CPP Clinical Pharmacist (Rheumatology and Pulmonology)

## 2022-04-15 ENCOUNTER — Encounter: Payer: Self-pay | Admitting: Physician Assistant

## 2022-04-15 ENCOUNTER — Ambulatory Visit: Payer: 59 | Attending: Physician Assistant | Admitting: Physician Assistant

## 2022-04-15 ENCOUNTER — Telehealth: Payer: Self-pay | Admitting: Pharmacist

## 2022-04-15 VITALS — BP 128/78 | HR 68 | Resp 12 | Ht 70.0 in | Wt 167.0 lb

## 2022-04-15 DIAGNOSIS — Z79899 Other long term (current) drug therapy: Secondary | ICD-10-CM

## 2022-04-15 DIAGNOSIS — M458 Ankylosing spondylitis sacral and sacrococcygeal region: Secondary | ICD-10-CM | POA: Diagnosis not present

## 2022-04-15 DIAGNOSIS — Z111 Encounter for screening for respiratory tuberculosis: Secondary | ICD-10-CM

## 2022-04-15 DIAGNOSIS — Z9225 Personal history of immunosupression therapy: Secondary | ICD-10-CM

## 2022-04-15 DIAGNOSIS — Z1589 Genetic susceptibility to other disease: Secondary | ICD-10-CM

## 2022-04-15 DIAGNOSIS — R748 Abnormal levels of other serum enzymes: Secondary | ICD-10-CM

## 2022-04-15 NOTE — Patient Instructions (Signed)
Standing Labs We placed an order today for your standing lab work.   Please have your standing labs drawn in May and every 3 months   Please have your labs drawn 2 weeks prior to your appointment so that the provider can discuss your lab results at your appointment.  Please note that you may see your imaging and lab results in [place] before we have reviewed them. We will contact you once all results are reviewed. Please allow our office up to 72 hours to thoroughly review all of the results before contacting the office for clarification of your results.  Lab hours are:   Monday through Thursday from 8:00 am -12:30 pm and 1:00 pm-5:00 pm and Friday from 8:00 am-12:00 pm.  Please be advised, all patients with office appointments requiring lab work will take precedent over walk-in lab work.   Labs are drawn by [facility]. Please bring your co-pay at the time of your lab draw.  You may receive a bill from [facility] for your lab work.  Please note if you are on Hydroxychloroquine and and an order has been placed for a Hydroxychloroquine level, you will need to have it drawn 4 hours or more after your last dose.  If you wish to have your labs drawn at another location, please call the office 24 hours in advance so we can fax the orders.  The office is located at [address] [place], [place], [place] [address] No appointment is necessary.    If you have any questions regarding directions or hours of operation,  please call [phone].   As a reminder, please drink plenty of water prior to coming for your lab work. Thanks!

## 2022-04-15 NOTE — Telephone Encounter (Signed)
Please start Enbrel BIV. PA has expired as patient was not seen since Dec 2022.  Knox Saliva, PharmD, MPH, BCPS, CPP Clinical Pharmacist (Rheumatology and Pulmonology)

## 2022-04-15 NOTE — Progress Notes (Signed)
Office Visit Note  Patient: Carl Lewis             Date of Birth: [birth year]           MRN: [id number]             PCP: Isaac Bliss, Rayford Halsted, MD Referring: Isaac Bliss, [place] [facility]* Visit Date: 04/15/2022 Occupation: @GUAROCC @  Subjective:  Medication monitoring   History of Present Illness: Carl Lewis is a 27 y.o. male with history of ankylosing spondylitis.  Patient remains on Enbrel 50 mg sq injections once weekly.  Patient was last seen in the office on 02/19/2021.  Patient denies any signs or symptoms of an ankylosing spondylitis flare since his last office visit.  Hist last dose of enbrel was administered on Friday.  He has not been experiencing any morning stiffness, nocturnal pain, or difficulty performing ADLs.  He denies any joint swelling at this time.  He remains active exercising on a regular basis.  He denies any Achilles tendinitis or plantar fasciitis.  He denies any SI joint pain currently.  He denies any new medical conditions.  He has not had any recent or recurrent infections.  Patient reports that he is due for his next Enbrel dose tomorrow.    Activities of Daily Living:  Patient reports morning stiffness for 0 minute.   Patient Denies nocturnal pain.  Difficulty dressing/grooming: Denies Difficulty climbing stairs: Denies Difficulty getting out of chair: Denies Difficulty using hands for taps, buttons, cutlery, and/or writing: Denies  Review of Systems  Constitutional:  Negative for fatigue.  HENT:  Negative for mouth sores and mouth dryness.   Eyes:  Positive for dryness.  Respiratory:  Negative for shortness of breath.   Cardiovascular:  Negative for chest pain and palpitations.  Gastrointestinal:  Negative for blood in stool, constipation and diarrhea.  Endocrine: Negative for increased urination.  Genitourinary:  Negative for involuntary urination.  Musculoskeletal:  Positive for joint pain, gait problem and joint pain. Negative for joint  swelling, myalgias, muscle weakness, morning stiffness, muscle tenderness and myalgias.  Skin:  Positive for rash. Negative for color change, hair loss and sensitivity to sunlight.  Allergic/Immunologic: Negative for susceptible to infections.  Neurological:  Negative for dizziness and headaches.  Hematological:  Negative for swollen glands.  Psychiatric/Behavioral:  Positive for depressed mood. Negative for sleep disturbance. The patient is not nervous/anxious.     PMFS History:  Patient Active Problem List   Diagnosis Date Noted   Vitamin D deficiency 01/17/2020   Ankylosing spondylitis of thoracolumbar region ([place]) 05/17/2018   HLA B27 positive 05/17/2018   Plantar fasciitis, bilateral 05/17/2018   Sacroiliitis ([place]) 05/17/2018   Anxiety and depression 05/17/2018   Alcohol abuse 05/17/2018   Pain of upper abdomen 03/16/2018   Right flank pain, chronic 03/16/2018   Food intolerance in adult 03/16/2018    Past Medical History:  Diagnosis Date   Ankylosing spondylitis ([place])    Anxiety and depression    Sacroiliitis ([place])     Family History  Problem Relation Age of Onset   Asthma Mother    Anxiety disorder Mother    Arthritis Father    Healthy Sister    Colon cancer Neg Hx    Esophageal cancer Neg Hx    Inflammatory bowel disease Neg Hx    Liver disease Neg Hx    Pancreatic cancer Neg Hx    Rectal cancer Neg Hx    Stomach cancer Neg Hx    Past Surgical  History:  Procedure Laterality Date   NO PAST SURGERIES     VASECTOMY  06/2021   Social History   Social History Narrative   Not on file   Immunization History  Administered Date(s) Administered   HPV 9-valent 12/18/2010, 02/16/2013, 07/05/2013, 07/01/2020   Influenza,inj,Quad PF,6+ Mos 01/16/2020   PFIZER(Purple Top)SARS-COV-2 Vaccination 08/07/2019, 09/06/2019, 03/21/2020   Tdap 07/01/2020     Objective: Vital Signs: BP 128/78 (BP Location: Left Arm, Patient Position: Sitting, Cuff Size: Small)   Pulse 68    Resp 12   Ht 5\' 10"  (1.778 m)   Wt 167 lb (75.8 kg)   BMI 23.96 kg/m    Physical Exam Vitals and nursing note reviewed.  Constitutional:      Appearance: He is well-developed.  HENT:     Head: Normocephalic and atraumatic.  Eyes:     Conjunctiva/sclera: Conjunctivae normal.     Pupils: Pupils are equal, round, and reactive to light.  Cardiovascular:     Rate and Rhythm: Normal rate and regular rhythm.     Heart sounds: Normal heart sounds.  Pulmonary:     Effort: Pulmonary effort is normal.     Breath sounds: Normal breath sounds.  Abdominal:     General: Bowel sounds are normal.     Palpations: Abdomen is soft.  Musculoskeletal:     Cervical back: Normal range of motion and neck supple.  Skin:    General: Skin is warm and dry.     Capillary Refill: Capillary refill takes less than 2 seconds.  Neurological:     Mental Status: He is alert and oriented to person, place, and time.  Psychiatric:        Behavior: Behavior normal.      Musculoskeletal Exam: C-spine, thoracic spine, and lumbar spine good ROM.  No midline spinal tenderness or SI joint tenderness upon palpation.  Shoulder joints, elbow joints, wrist joints MCPs, PIPs, DIPs have good range of motion with no synovitis.  Complete fist formation bilaterally.  Hip joints have good range of motion with no groin pain.  Knee joints have good range of motion with no warmth or effusion.  Ankle joints have good range of motion with no tenderness or joint swelling.  No evidence of Achilles tendinitis or plantar fasciitis.  CDAI Exam: CDAI Score: -- Patient Global: --; Provider Global: -- Swollen: --; Tender: -- Joint Exam 04/15/2022   No joint exam has been documented for this visit   There is currently no information documented on the homunculus. Go to the Rheumatology activity and complete the homunculus joint exam.  Investigation: No additional findings.  Imaging: No results found.  Recent Labs: Lab Results   Component Value Date   WBC 4.7 06/02/2021   HGB 14.9 06/02/2021   PLT 226 06/02/2021   NA 141 06/02/2021   K 4.0 06/02/2021   CL 104 06/02/2021   CO2 27 06/02/2021   GLUCOSE 82 06/02/2021   BUN 13 06/02/2021   CREATININE 0.96 06/02/2021   BILITOT 0.6 06/02/2021   ALKPHOS 56 03/10/2018   AST 27 06/02/2021   ALT 13 06/02/2021   PROT 7.2 06/02/2021   ALBUMIN 4.6 03/10/2018   CALCIUM 9.5 06/02/2021   GFRAA 127 09/05/2020   QFTBGOLDPLUS NEGATIVE 06/02/2021    Speciality Comments: No specialty comments available.  Procedures:  No procedures performed Allergies: Cat hair extract, Molds & smuts, and Tobacco   Assessment / Plan:     Visit Diagnoses: Ankylosing spondylitis of sacral region (  [place]): He has not had any signs or symptoms of an ankylosing spondylitis flare.  He has clinically been doing well on Enbrel 50 mg subcutaneous injections once weekly.  His last dose of Enbrel was administered on Friday.  He was last seen in the office on 02/19/2021.  He has not been experiencing any morning stiffness, nocturnal pain, or difficulty with ADLs.  He remains active exercising on a regular basis.  On examination he has good range of motion of the C-spine, thoracic spine, and lumbar spine.  He had no midline spinal tenderness or SI joint tenderness upon palpation.  No evidence of Achilles tendinitis or plantar fasciitis.  No evidence of synovitis or dactylitis noted today.  He will remain on Enbrel as monotherapy.  A prior authorization will be completed and a refill be sent to the pharmacy pending approval.  Updated lab work was obtained today with the patient was in the office.  Discussed the importance of keeping up with lab work every 3 months to monitor for toxicity.  He will follow-up in the office in 3 months or sooner if needed.  High risk medication use -Enbrel 50 mg subcutaneous injections once weekly. CBC and CMP were drawn on 06/02/2021.  Patient is overdue to update lab work.  Orders  for CBC and CMP were released.  His next lab work will be due in May and every 3 months to monitor for drug toxicity. TB Gold negative on 06/02/2021.  Future order for TB gold will be placed today. No recent or recurrent infections.  Discussed the importance of holding Enbrel if he develops signs or symptoms of an infection and to resume once the infection has completely cleared.Plan: CBC with Differential/Platelet, COMPLETE METABOLIC PANEL WITH GFR, QuantiFERON-TB Gold Plus, CBC with Differential/Platelet, COMPLETE METABOLIC PANEL WITH GFR No new medical conditions.   Elevated CK: No muscular weakness.  Patient remains active exercising on a regular basis.  HLA B27 positive  Screening for tuberculosis -Future order for TB Gold placed today.  Plan: QuantiFERON-TB Gold Plus  Orders: Orders Placed This Encounter  Procedures   CBC with Differential/Platelet   COMPLETE METABOLIC PANEL WITH GFR   QuantiFERON-TB Gold Plus   CBC with Differential/Platelet   COMPLETE METABOLIC PANEL WITH GFR   No orders of the defined types were placed in this encounter.    Follow-Up Instructions: Return in about 5 months (around 09/13/2022) for Ankylosing Spondylitis.   Ofilia Neas, PA-C  Note - This record has been created using Dragon software.  Chart creation errors have been sought, but may not always  have been located. Such creation errors do not reflect on  the standard of medical care.

## 2022-04-16 ENCOUNTER — Other Ambulatory Visit (HOSPITAL_COMMUNITY): Payer: Self-pay

## 2022-04-16 LAB — COMPLETE METABOLIC PANEL WITH GFR
AG Ratio: 1.9 (calc) (ref 1.0–2.5)
ALT: 11 U/L (ref 9–46)
AST: 23 U/L (ref 10–40)
Albumin: 5 g/dL (ref 3.6–5.1)
Alkaline phosphatase (APISO): 54 U/L (ref 36–130)
BUN: 15 mg/dL (ref 7–25)
CO2: 25 mmol/L (ref 20–32)
Calcium: 9.9 mg/dL (ref 8.6–10.3)
Chloride: 102 mmol/L (ref 98–110)
Creat: 0.92 mg/dL (ref 0.60–1.24)
Globulin: 2.7 g/dL (calc) (ref 1.9–3.7)
Glucose, Bld: 89 mg/dL (ref 65–99)
Potassium: 3.7 mmol/L (ref 3.5–5.3)
Sodium: 139 mmol/L (ref 135–146)
Total Bilirubin: 0.5 mg/dL (ref 0.2–1.2)
Total Protein: 7.7 g/dL (ref 6.1–8.1)
eGFR: 117 mL/min/{1.73_m2} (ref >=60)

## 2022-04-16 LAB — CBC WITH DIFFERENTIAL/PLATELET
Absolute Monocytes: 616 cells/uL (ref 200–950)
Basophils Absolute: 61 cells/uL (ref 0–200)
Basophils Relative: 1.1 %
Eosinophils Absolute: 160 cells/uL (ref 15–500)
Eosinophils Relative: 2.9 %
HCT: 42.8 % (ref 38.5–50.0)
Hemoglobin: 15 g/dL (ref 13.2–17.1)
Lymphs Abs: 2371 cells/uL (ref 850–3900)
MCH: 33.6 pg — ABNORMAL HIGH (ref 27.0–33.0)
MCHC: 35 g/dL (ref 32.0–36.0)
MCV: 95.7 fL (ref 80.0–100.0)
MPV: 10.7 fL (ref 7.5–12.5)
Monocytes Relative: 11.2 %
Neutro Abs: 2294 cells/uL (ref 1500–7800)
Neutrophils Relative %: 41.7 %
Platelets: 226 10*3/uL (ref 140–400)
RBC: 4.47 10*6/uL (ref 4.20–5.80)
RDW: 11.9 % (ref 11.0–15.0)
Total Lymphocyte: 43.1 %
WBC: 5.5 10*3/uL (ref 3.8–10.8)

## 2022-04-16 NOTE — Telephone Encounter (Signed)
Patient appears to have two plans: [employer] and [place] Medicaid  Submitted a Prior Authorization request to  [facility]  for ENBREL via CoverMyMeds. Pending clinical questions to populate  Key: [id number]  Submitted a Prior Authorization request to  [facility]  for ENBREL via CoverMyMeds. Will update once we receive a response.  Key: [id number]  Knox Saliva, PharmD, MPH, BCPS, CPP Clinical Pharmacist (Rheumatology and Pulmonology)

## 2022-04-16 NOTE — Progress Notes (Signed)
CBC and CMP WNL

## 2022-04-19 ENCOUNTER — Other Ambulatory Visit (HOSPITAL_COMMUNITY): Payer: Self-pay

## 2022-04-19 MED ORDER — ENBREL MINI 50 MG/ML ~~LOC~~ SOCT: SUBCUTANEOUS | 2 refills | Status: DC

## 2022-04-19 NOTE — Telephone Encounter (Signed)
Received notification from  [facility]  regarding a prior authorization for ENBREL. Authorization has been APPROVED from 04/16/2022 to 04/16/2023. Approval letter sent to scan center.  Per test claim, copay for 28 days supply is $4  Patient can continue to fill through [facility]: [phone]  Authorization # [id number]  PA through [employer] is still pending but it appears the two plans are not coordinated with benefits. Medicaid test claim did not reject  MyChart message sent to patient. Refill of Enbrel sent to [facility] today  Knox Saliva, PharmD, MPH, BCPS, CPP Clinical Pharmacist (Rheumatology and Pulmonology)

## 2022-04-23 ENCOUNTER — Other Ambulatory Visit (HOSPITAL_COMMUNITY): Payer: Self-pay

## 2022-04-23 MED ORDER — ENBREL MINI 50 MG/ML ~~LOC~~ SOCT: SUBCUTANEOUS | 2 refills | Status: DC

## 2022-04-23 NOTE — Telephone Encounter (Signed)
Received notification from  [facility]  regarding a prior authorization for ENBREL. Authorization has been APPROVED from 04/16/2022 to 04/17/2023. Approval letter sent to scan center.  Unable to run test claim because patient must fill through [facility] [phone] )  Authorization # [id number]  MyChart message sent to patient to advise.  Knox Saliva, PharmD, MPH, BCPS, CPP Clinical Pharmacist (Rheumatology and Pulmonology)

## 2022-05-05 NOTE — Telephone Encounter (Signed)
Spoke with patient to let him know that he will need to coordinate benefits for his Medicaid and [employer]. Patient stated he would reach out to Medicaid to get this sorted out.   Maryan Puls, PharmD PGY-1 [facility] Resident

## 2022-06-09 NOTE — Telephone Encounter (Signed)
[place] to determine if patient has filled Enbrel. Per rep, they reached out to patient but he cancelled order. He said he had shipment set up with [facility]. I did inquire if he ended up coordinating benefits but he didn't. I again explained to him if he coordinated benefits then Medicaid will pick up cost of medication as secondary coverage. Will close encounter.  Patient states he has shipment scheduled (unable to confirm date) and he is aware of copay (again unable to confirm)  Knox Saliva, PharmD, MPH, BCPS, CPP Clinical Pharmacist (Rheumatology and Pulmonology)

## 2022-06-28 ENCOUNTER — Telehealth: Payer: Self-pay

## 2022-06-28 NOTE — Telephone Encounter (Signed)
Patient contacted the office and stated [employer] has been trying to reach [place] to replace the patients Enbrel auto touch device. Advised patient I did not see anything from [employer]. Advised patient I would call [employer]. Contacted [employer] and talked to Reuel Boom who is a Teacher, early years/pre. Reuel Boom inquired about a verbal authorization to replace the auto touch device. Advised Reuel Boom it is okay to replace device. Contacted patient to advise [employer] should be replacing his Auto Touch device.

## 2022-06-30 ENCOUNTER — Encounter: Payer: Self-pay | Admitting: *Deleted

## 2022-06-30 ENCOUNTER — Other Ambulatory Visit: Payer: Self-pay | Admitting: Physician Assistant

## 2022-06-30 DIAGNOSIS — M458 Ankylosing spondylitis sacral and sacrococcygeal region: Secondary | ICD-10-CM

## 2022-06-30 NOTE — Telephone Encounter (Signed)
Last Fill: 04/23/2022  Labs: 04/15/2022 CBC and CMP WNL   TB Gold: 06/02/2021 Neg    Next Visit: Due July 2024. Message sent to the front to schedule.   Last Visit: 04/15/2022  DX:Ankylosing spondylitis of sacral region   Current Dose per office note 04/15/2022: Enbrel 50 mg subcutaneous injections once weekly.   Sent message via my chart to advise patient he is due to update labs.   Okay to refill Enbrel?

## 2022-11-25 ENCOUNTER — Ambulatory Visit: Payer: 59 | Admitting: Family Medicine

## 2022-12-02 ENCOUNTER — Ambulatory Visit: Payer: Medicaid Other | Admitting: Allergy & Immunology

## 2022-12-16 ENCOUNTER — Other Ambulatory Visit: Payer: Self-pay | Admitting: Rheumatology

## 2022-12-16 DIAGNOSIS — M458 Ankylosing spondylitis sacral and sacrococcygeal region: Secondary | ICD-10-CM

## 2023-01-04 ENCOUNTER — Other Ambulatory Visit: Payer: Self-pay

## 2023-01-04 ENCOUNTER — Encounter: Payer: Self-pay | Admitting: Allergy & Immunology

## 2023-01-04 ENCOUNTER — Ambulatory Visit (INDEPENDENT_AMBULATORY_CARE_PROVIDER_SITE_OTHER): Payer: 59 | Admitting: Allergy & Immunology

## 2023-01-04 VITALS — BP 130/72 | HR 86 | Temp 98.2°F | Resp 16 | Ht 70.0 in | Wt 173.1 lb

## 2023-01-04 DIAGNOSIS — J302 Other seasonal allergic rhinitis: Secondary | ICD-10-CM | POA: Diagnosis not present

## 2023-01-04 DIAGNOSIS — D849 Immunodeficiency, unspecified: Secondary | ICD-10-CM

## 2023-01-04 DIAGNOSIS — J3089 Other allergic rhinitis: Secondary | ICD-10-CM | POA: Diagnosis not present

## 2023-01-04 DIAGNOSIS — M455 Ankylosing spondylitis of thoracolumbar region: Secondary | ICD-10-CM

## 2023-01-04 MED ORDER — AZELASTINE-FLUTICASONE 137-50 MCG/ACT NA SUSP: 1.0000 | Freq: Two times a day (BID) | NASAL | 0 refills | Status: DC

## 2023-01-04 MED ORDER — LEVOCETIRIZINE DIHYDROCHLORIDE 5 MG PO TABS: 5.0000 mg | ORAL_TABLET | Freq: Every evening | ORAL | 1 refills | Status: DC

## 2023-01-04 NOTE — Progress Notes (Signed)
NEW PATIENT  Date of Service/Encounter:  01/04/23  Consult requested by: Philip Aspen, Limmie Patricia, MD   Assessment:   Seasonal and perennial allergic rhinitis  Ankylosing spondylitis - on Enbrel and doing well  Immunosuppressed status  Plan/Recommendations:    1. Seasonal and perennial allergic rhinitis (grasses, weeds, trees, molds, dust mite, cat, and tobacco) - Restart the Dymista two sprays up to twice daily. - Restart Xyzal and use one tablet twice daily during flares.  - Consider allergy shots for long term curative control.  2. Return in about 6 months (around 07/05/2023). You can have the follow up appointment with Dr. Dellis Anes or a Nurse Practicioner (our Nurse Practitioners are excellent and always have Physician oversight!).    This note in its entirety was forwarded to the Provider who requested this consultation.  Subjective:   Carl Lewis is a 27 y.o. male presenting today for evaluation of  Chief Complaint  Patient presents with   Allergic Rhinitis     Mr. Weatherington needs a refill on nasal sprays. He reports nasal spray keeps his controlled.     Hilery Rumler has a history of the following: Patient Active Problem List   Diagnosis Date Noted   Vitamin D deficiency 01/17/2020   Ankylosing spondylitis of thoracolumbar region ([facility]) 05/17/2018   HLA B27 positive 05/17/2018   Plantar fasciitis, bilateral 05/17/2018   Sacroiliitis ([facility]) 05/17/2018   Anxiety and depression 05/17/2018   Alcohol abuse 05/17/2018   Pain of upper abdomen 03/16/2018   Right flank pain, chronic 03/16/2018   Food intolerance in adult 03/16/2018    History obtained from: chart review and patient.  Discussed the use of AI scribe software for clinical note transcription with the patient and/or guardian, who gave verbal consent to proceed.  Macallister Schuth was referred by Philip Aspen, Limmie Patricia, MD.     Carl Lewis is a 27 y.o. male presenting for an evaluation of environmental  allergies .    The patient, previously seen by Dr. Delorse Lek, presents for a refill of their nasal spray, which they last used a few weeks ago. They report that the antihistamine Xyzal does not effectively manage their symptoms, which are worst during the change of seasons. The patient has a history of allergies to grasses, weeds, trees, loams, dust mites, and cats, but does not own any pets. Their nasal spray is affordable with their insurance and they wish to continue its use.  The patient also experiences allergy attacks at random intervals, during which they find no relief from any medication. They express interest in a solution to stop these attacks.  The patient reports daily nasal symptoms but denies any asthma or hives. They have a history of sinus infections, but these are rare.     Allergy testing: Environmental allergy skin prick testing is positive to [place] grass pollen, mugwort weed pollen, tree pollens (ash, birch, Box Elder, red cedar, elm, hickory, maple, oak, pecan), epicoccum nigrum, both dust mites, cat hair and tobacco leaf.   In addition to their allergy issues, the patient has a history of ankylosing spondylitis, diagnosed ten years ago, and has been on Enbrel for the past five years. They report no flares requiring prednisone. They also have a history of gastrointestinal issues, which have since resolved after seeing a GI specialist.  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 They see a rheumatologist every few months for lab work and check-ups. Their ankylosing spondylitis initially presented  with back and hip pain.  Otherwise, there is no history of other atopic diseases, including asthma, food allergies, drug allergies, stinging insect allergies, eczema, urticaria, or contact dermatitis. There is no significant infectious history. Vaccinations are up to date.    Past Medical History: Patient Active Problem List   Diagnosis Date Noted    Vitamin D deficiency 01/17/2020   Ankylosing spondylitis of thoracolumbar region ([facility]) 05/17/2018   HLA B27 positive 05/17/2018   Plantar fasciitis, bilateral 05/17/2018   Sacroiliitis ([facility]) 05/17/2018   Anxiety and depression 05/17/2018   Alcohol abuse 05/17/2018   Pain of upper abdomen 03/16/2018   Right flank pain, chronic 03/16/2018   Food intolerance in adult 03/16/2018    Medication List:  Allergies as of 01/04/2023       Reactions   Cat Hair Extract    Molds & Smuts    Tobacco         Medication List        Accurate as of January 04, 2023 10:49 AM. If you have any questions, ask your nurse or doctor.          STOP taking these medications    omeprazole 40 MG capsule Commonly known as: PRILOSEC Stopped by: Alfonse Spruce       TAKE these medications    Azelastine-Fluticasone 137-50 MCG/ACT Susp Place 1 spray into the nose in the morning and at bedtime.   buPROPion ER 100 MG 12 hr tablet Commonly known as: WELLBUTRIN SR Take 100 mg by mouth daily.   cetirizine 10 MG tablet Commonly known as: ZYRTEC Take 10 mg by mouth as needed.   Enbrel Mini 50 MG/ML injection Generic drug: etanercept INSERT 1 MINI CARTRIDGE INTO AUTOINJECTOR AND INJECT 50 MG UNDER THE SKIN EVERY 7 DAYS   fluocinonide ointment 0.05 % Commonly known as: LIDEX Apply topically 2 (two) times daily as needed.   ibuprofen 200 MG tablet Commonly known as: ADVIL Take 200 mg by mouth every 6 (six) hours as needed for fever or mild pain.   lamoTRIgine 100 MG tablet Commonly known as: LAMICTAL   levocetirizine 5 MG tablet Commonly known as: XYZAL Take 1 tablet (5 mg total) by mouth every evening. Started by: Alfonse Spruce   Olopatadine HCl 0.2 % Soln Commonly known as: Pataday Place 1 drop into both eyes daily as needed.   sildenafil 25 MG tablet Commonly known as: VIAGRA Take 25 mg by mouth as needed for erectile dysfunction.   triamcinolone cream 0.1  % Commonly known as: KENALOG Apply topically 2 (two) times daily as needed.        Birth History: non-contributory  Developmental History: non-contributory  Past Surgical History: Past Surgical History:  Procedure Laterality Date   NO PAST SURGERIES     VASECTOMY  06/2021     Family History: Family History  Problem Relation Age of Onset   Asthma Mother    Anxiety disorder Mother    Arthritis Father    Healthy Sister    Colon cancer Neg Hx    Esophageal cancer Neg Hx    Inflammatory bowel disease Neg Hx    Liver disease Neg Hx    Pancreatic cancer Neg Hx    Rectal cancer Neg Hx    Stomach cancer Neg Hx      Social History: Ermias lives at home with his girlfriend. There is no smoking exposure. There are no animals in he home. He works as a Psychologist, prison and probation services.  Review of systems otherwise negative other than that mentioned in the HPI.    Objective:   Blood pressure 130/72, pulse 86, temperature 98.2 F (36.8 C), temperature source Temporal, resp. rate 16, height 5\' 10"  (1.778 m), weight 173 lb 1.6 oz (78.5 kg), SpO2 97%. Body mass index is 24.84 kg/m.     Physical Exam Vitals reviewed.  Constitutional:      Appearance: He is well-developed.  HENT:     Head: Normocephalic and atraumatic.     Right Ear: Tympanic membrane, ear canal and external ear normal. No drainage, swelling or tenderness. Tympanic membrane is not injected, scarred, erythematous, retracted or bulging.     Left Ear: Tympanic membrane, ear canal and external ear normal. No drainage, swelling or tenderness. Tympanic membrane is not injected, scarred, erythematous, retracted or bulging.     Nose: No nasal deformity, septal deviation, mucosal edema or rhinorrhea.     Right Turbinates: Enlarged, swollen and pale.     Left Turbinates: Enlarged, swollen and pale.     Right Sinus: No maxillary sinus tenderness or frontal sinus tenderness.     Left Sinus: No maxillary sinus tenderness or  frontal sinus tenderness.     Mouth/Throat:     Lips: Pink.     Mouth: Mucous membranes are moist. Mucous membranes are not pale and not dry.     Pharynx: Uvula midline.     Comments: Moderate cobblestoning.  Eyes:     General:        Right eye: No discharge.        Left eye: No discharge.     Conjunctiva/sclera: Conjunctivae normal.     Right eye: Right conjunctiva is not injected. No chemosis.    Left eye: Left conjunctiva is not injected. No chemosis.    Pupils: Pupils are equal, round, and reactive to light.  Cardiovascular:     Rate and Rhythm: Normal rate and regular rhythm.     Heart sounds: Normal heart sounds.  Pulmonary:     Effort: Pulmonary effort is normal. No tachypnea, accessory muscle usage or respiratory distress.     Breath sounds: Normal breath sounds. No wheezing, rhonchi or rales.     Comments: Moving air well in all lung fields.  Chest:     Chest wall: No tenderness.  Abdominal:     Tenderness: There is no abdominal tenderness. There is no guarding or rebound.  Lymphadenopathy:     Head:     Right side of head: No submandibular, tonsillar or occipital adenopathy.     Left side of head: No submandibular, tonsillar or occipital adenopathy.     Cervical: No cervical adenopathy.  Skin:    General: Skin is warm.     Capillary Refill: Capillary refill takes less than 2 seconds.     Coloration: Skin is not pale.     Findings: No abrasion, erythema, petechiae or rash. Rash is not papular, urticarial or vesicular.  Neurological:     Mental Status: He is alert.  Psychiatric:        Behavior: Behavior is cooperative.      Diagnostic studies: none       Malachi Bonds, MD [facility]

## 2023-01-04 NOTE — Patient Instructions (Addendum)
1. Seasonal and perennial allergic rhinitis (grasses, weeds, trees, molds, dust mite, cat, and tobacco) - Restart the Dymista two sprays up to twice daily. - Restart Xyzal and use one tablet twice daily during flares.  - Consider allergy shots for long term curative control.  2. Return in about 6 months (around 07/05/2023). You can have the follow up appointment with Dr. Dellis Anes or a Nurse Practicioner (our Nurse Practitioners are excellent and always have Physician oversight!).    Please inform us of any Emergency Department visits, hospitalizations, or changes in symptoms. Call us before going to the ED for breathing or allergy symptoms since we might be able to fit you in for a sick visit. Feel free to contact us anytime with any questions, problems, or concerns.  It was a pleasure to meet you today!  Websites that have reliable patient information: 1. American Academy of Asthma, Allergy, and Immunology: www.aaaai.org 2. Food Allergy Research and Education (FARE): foodallergy.org 3. Mothers of Asthmatics: http://www.asthmacommunitynetwork.org 4. American College of Allergy, Asthma, and Immunology: www.acaai.org   COVID-19 Vaccine Information can be found at: [email or web address] For questions related to vaccine distribution or appointments, please email [email or web address] or call [phone].     "Like" [place] on Facebook and Instagram for our latest updates!      A healthy democracy works best when [employer] participate! Make sure you are registered to vote! If you have moved or changed any of your contact information, you will need to get this updated before voting! Scan the QR codes below to learn more!       Allergy Shots  Allergies are the result of a chain reaction that starts in the immune system. Your immune system controls how your body defends itself. For instance, if you have an allergy to pollen, your immune  system identifies pollen as an invader or allergen. Your immune system overreacts by producing antibodies called Immunoglobulin E (IgE). These antibodies travel to cells that release chemicals, causing an allergic reaction.  The concept behind allergy immunotherapy, whether it is received in the form of shots or tablets, is that the immune system can be desensitized to specific allergens that trigger allergy symptoms. Although it requires time and patience, the payback can be long-term relief. Allergy injections contain a dilute solution of those substances that you are allergic to based upon your skin testing and allergy history.   How Do Allergy Shots Work?  Allergy shots work much like a vaccine. Your body responds to injected amounts of a particular allergen given in increasing doses, eventually developing a resistance and tolerance to it. Allergy shots can lead to decreased, minimal or no allergy symptoms.  There generally are two phases: build-up and maintenance. Build-up often ranges from three to six months and involves receiving injections with increasing amounts of the allergens. The shots are typically given once or twice a week, though more rapid build-up schedules are sometimes used.  The maintenance phase begins when the most effective dose is reached. This dose is different for each person, depending on how allergic you are and your response to the build-up injections. Once the maintenance dose is reached, there are longer periods between injections, typically two to four weeks.  Occasionally doctors give cortisone-type shots that can temporarily reduce allergy symptoms. These types of shots are different and should not be confused with allergy immunotherapy shots.  Who Can Be Treated with Allergy Shots?  Allergy shots may be a good treatment approach for people with allergic rhinitis (hay fever),  allergic asthma, conjunctivitis (eye allergy) or stinging insect allergy.   Before  deciding to begin allergy shots, you should consider:   The length of allergy season and the severity of your symptoms  Whether medications and/or changes to your environment can control your symptoms  Your desire to avoid long-term medication use  Time: allergy immunotherapy requires a major time commitment  Cost: may vary depending on your insurance coverage  Allergy shots for children age 68 and older are effective and often well tolerated. They might prevent the onset of new allergen sensitivities or the progression to asthma.  Allergy shots are not started on patients who are pregnant but can be continued on patients who become pregnant while receiving them. In some patients with other medical conditions or who take certain common medications, allergy shots may be of risk. It is important to mention other medications you talk to your allergist.   What are the two types of build-ups offered:   RUSH or Rapid Desensitization -- one day of injections lasting from 8:30-4:30pm, injections every 1 hour.  Approximately half of the build-up process is completed in that one day.  The following week, normal build-up is resumed, and this entails ~16 visits either weekly or twice weekly, until reaching your "maintenance dose" which is continued weekly until eventually getting spaced out to every month for a duration of 3 to 5 years. The regular build-up appointments are nurse visits where the injections are administered, followed by required monitoring for 30 minutes.    Traditional build-up -- weekly visits for 6 -12 months until reaching "maintenance dose", then continue weekly until eventually spacing out to every 4 weeks as above. At these appointments, the injections are administered, followed by required monitoring for 30 minutes.     Either way is acceptable, and both are equally effective. With the rush protocol, the advantage is that less time is spent here for injections overall AND you would  also reach maintenance dosing faster (which is when the clinical benefit starts to become more apparent). Not everyone is a candidate for rapid desensitization.   IF we proceed with the RUSH protocol, there are premedications which must be taken the day before and the day after the rush only (this includes antihistamines, steroids, and Singulair).  After the rush day, no prednisone or Singulair is required, and we just recommend antihistamines taken on your injection day.  What Is An Estimate of the Costs?  If you are interested in starting allergy injections, please check with your insurance company about your coverage for both allergy vial sets and allergy injections.  Please do so prior to making the appointment to start injections.  The following are CPT codes to give to your insurance company. These are the amounts we BILL to the insurance company, but the amount YOU WILL PAY and WE RECEIVE IS SUBSTANTIALLY LESS and depends on the contracts we have with different insurance companies.   Amount Billed to Insurance One allergy vial set  CPT 95165   $ 1200     Two allergy vial set  CPT 95165   $ 2400     Three allergy vial set  CPT 95165   $ 3600     One injection   CPT 95115   $ 35  Two injections   CPT 95117   $ 40 RUSH (Rapid Desensitization) CPT 95180 x 8 hours $500/hour  Regarding the allergy injections, your co-pay may or may not apply with each injection, so please  confirm this with your insurance company. When you start allergy injections, 1 or 2 sets of vials are made based on your allergies.  Not all patients can be on one set of vials. A set of vials lasts 6 months to a year depending on how quickly you can proceed with your build-up of your allergy injections. Vials are personalized for each patient depending on their specific allergens.  How often are allergy injection given during the build-up period?   Injections are given at least weekly during the build-up period until your  maintenance dose is achieved. Per the doctor's discretion, you may have the option of getting allergy injections two times per week during the build-up period. However, there must be at least 48 hours between injections. The build-up period is usually completed within 6-12 months depending on your ability to schedule injections and for adjustments for reactions. When maintenance dose is reached, your injection schedule is gradually changed to every two weeks and later to every three weeks. Injections will then continue every 4 weeks. Usually, injections are continued for a total of 3-5 years.   When Will I Feel Better?  Some may experience decreased allergy symptoms during the build-up phase. For others, it may take as long as 12 months on the maintenance dose. If there is no improvement after a year of maintenance, your allergist will discuss other treatment options with you.  If you aren't responding to allergy shots, it may be because there is not enough dose of the allergen in your vaccine or there are missing allergens that were not identified during your allergy testing. Other reasons could be that there are high levels of the allergen in your environment or major exposure to non-allergic triggers like tobacco smoke.  What Is the Length of Treatment?  Once the maintenance dose is reached, allergy shots are generally continued for three to five years. The decision to stop should be discussed with your allergist at that time. Some people may experience a permanent reduction of allergy symptoms. Others may relapse and a longer course of allergy shots can be considered.  What Are the Possible Reactions?  The two types of adverse reactions that can occur with allergy shots are local and systemic. Common local reactions include very mild redness and swelling at the injection site, which can happen immediately or several hours after. Report a delayed reaction from your last injection. These include arm  swelling or runny nose, watery eyes or cough that occurs within 12-24 hours after injection. A systemic reaction, which is less common, affects the entire body or a particular body system. They are usually mild and typically respond quickly to medications. Signs include increased allergy symptoms such as sneezing, a stuffy nose or hives.   Rarely, a serious systemic reaction called anaphylaxis can develop. Symptoms include swelling in the throat, wheezing, a feeling of tightness in the chest, nausea or dizziness. Most serious systemic reactions develop within 30 minutes of allergy shots. This is why it is strongly recommended you wait in your doctor's office for 30 minutes after your injections. Your allergist is trained to watch for reactions, and his or her staff is trained and equipped with the proper medications to identify and treat them.   Report to the nurse immediately if you experience any of the following symptoms: swelling, itching or redness of the skin, hives, watery eyes/nose, breathing difficulty, excessive sneezing, coughing, stomach pain, diarrhea, or light headedness. These symptoms may occur within 15-20 minutes after injection and  may require medication.   Who Should Administer Allergy Shots?  The preferred location for receiving shots is your prescribing allergist's office. Injections can sometimes be given at another facility where the physician and staff are trained to recognize and treat reactions, and have received instructions by your prescribing allergist.  What if I am late for an injection?   Injection dose will be adjusted depending upon how many days or weeks you are late for your injection.   What if I am sick?   Please report any illness to the nurse before receiving injections. She may adjust your dose or postpone injections depending on your symptoms. If you have fever, flu, sinus infection or chest congestion it is best to postpone allergy injections until you are  better. Never get an allergy injection if your asthma is causing you problems. If your symptoms persist, seek out medical care to get your health problem under control.  What If I am or Become Pregnant:  Women that become pregnant should schedule an appointment with [facility] before receiving any further allergy injections.

## 2023-01-06 ENCOUNTER — Telehealth: Payer: Self-pay | Admitting: Rheumatology

## 2023-01-06 NOTE — Telephone Encounter (Signed)
Patient advised he will need to have an appointment and updated labs prior to refill. Patient was not happy with this news and asked for one dose prior to his appointment he reluctantly scheduled for 01/13/2023. Patient advised as his labs are well over due and he will have to update them first.

## 2023-01-06 NOTE — Telephone Encounter (Signed)
Patient left a voicemail regarding his prescription refill.  Patient states the pharmacy is waiting for the approval from his prescribing doctor.  Patient requested a return call to let him know what needs to be done in order to receive his refill.

## 2023-01-07 NOTE — Progress Notes (Deleted)
Office Visit Note  Patient: Carl Lewis             Date of Birth: [birth year]           MRN: [id number]             PCP: Philip Aspen, Limmie Patricia, MD Referring: Philip Aspen, Almira Bar* Visit Date: 01/13/2023 Occupation: @GUAROCC @  Subjective:  No chief complaint on file.   History of Present Illness: [name] is a 27 y.o. male ***     Activities of Daily Living:  Patient reports morning stiffness for *** {minute/hour:19697}.   Patient {ACTIONS;DENIES/REPORTS:21021675::"Denies"} nocturnal pain.  Difficulty dressing/grooming: {ACTIONS;DENIES/REPORTS:21021675::"Denies"} Difficulty climbing stairs: {ACTIONS;DENIES/REPORTS:21021675::"Denies"} Difficulty getting out of chair: {ACTIONS;DENIES/REPORTS:21021675::"Denies"} Difficulty using hands for taps, buttons, cutlery, and/or writing: {ACTIONS;DENIES/REPORTS:21021675::"Denies"}  No Rheumatology ROS completed.   PMFS History:  Patient Active Problem List   Diagnosis Date Noted   Vitamin D deficiency 01/17/2020   Ankylosing spondylitis of thoracolumbar region (HCC) 05/17/2018   HLA B27 positive 05/17/2018   Plantar fasciitis, bilateral 05/17/2018   Sacroiliitis (HCC) 05/17/2018   Anxiety and depression 05/17/2018   Alcohol abuse 05/17/2018   Pain of upper abdomen 03/16/2018   Right flank pain, chronic 03/16/2018   Food intolerance in adult 03/16/2018    Past Medical History:  Diagnosis Date   Ankylosing spondylitis (HCC)    Anxiety and depression    Sacroiliitis (HCC)     Family History  Problem Relation Age of Onset   Asthma Mother    Anxiety disorder Mother    Arthritis Father    Healthy Sister    Colon cancer Neg Hx    Esophageal cancer Neg Hx    Inflammatory bowel disease Neg Hx    Liver disease Neg Hx    Pancreatic cancer Neg Hx    Rectal cancer Neg Hx    Stomach cancer Neg Hx    Past Surgical History:  Procedure Laterality Date   NO PAST SURGERIES     VASECTOMY  06/2021   Social History    Social History Narrative   Not on file   Immunization History  Administered Date(s) Administered   HPV 9-valent 12/18/2010, 02/16/2013, 07/05/2013, 07/01/2020   Influenza,inj,Quad PF,6+ Mos 01/16/2020   Influenza-Unspecified 11/07/2022   PFIZER(Purple Top)SARS-COV-2 Vaccination 08/07/2019, 09/06/2019, 03/21/2020   Tdap 07/01/2020     Objective: Vital Signs: There were no vitals taken for this visit.   Physical Exam   Musculoskeletal Exam: ***  CDAI Exam: CDAI Score: -- Patient Global: --; Provider Global: -- Swollen: --; Tender: -- Joint Exam 01/13/2023   No joint exam has been documented for this visit   There is currently no information documented on the homunculus. Go to the Rheumatology activity and complete the homunculus joint exam.  Investigation: No additional findings.  Imaging: No results found.  Recent Labs: Lab Results  Component Value Date   WBC 5.5 04/15/2022   HGB 15.0 04/15/2022   PLT 226 04/15/2022   NA 139 04/15/2022   K 3.7 04/15/2022   CL 102 04/15/2022   CO2 25 04/15/2022   GLUCOSE 89 04/15/2022   BUN 15 04/15/2022   CREATININE 0.92 04/15/2022   BILITOT 0.5 04/15/2022   ALKPHOS 56 03/10/2018   AST 23 04/15/2022   ALT 11 04/15/2022   PROT 7.7 04/15/2022   ALBUMIN 4.6 03/10/2018   CALCIUM 9.9 04/15/2022   GFRAA 127 09/05/2020   QFTBGOLDPLUS NEGATIVE 06/02/2021    Speciality Comments: No specialty comments available.  Procedures:  No procedures  performed Allergies: Cat hair extract, Molds & smuts, and Tobacco   Assessment / Plan:     Visit Diagnoses: Ankylosing spondylitis of sacral region (HCC)  High risk medication use  Elevated CK  HLA B27 positive  Plantar fasciitis, bilateral  Sacroiliitis (HCC)  Chronic RUQ pain  Anxiety and depression  History of alcohol use  Orders: No orders of the defined types were placed in this encounter.  No orders of the defined types were placed in this  encounter.   Face-to-face time spent with patient was *** minutes. Greater than 50% of time was spent in counseling and coordination of care.  Follow-Up Instructions: No follow-ups on file.   Gearldine Bienenstock, PA-C  Note - This record has been created using Dragon software.  Chart creation errors have been sought, but may not always  have been located. Such creation errors do not reflect on  the standard of medical care.

## 2023-01-13 ENCOUNTER — Ambulatory Visit: Payer: 59 | Admitting: Physician Assistant

## 2023-01-13 DIAGNOSIS — G8929 Other chronic pain: Secondary | ICD-10-CM

## 2023-01-13 DIAGNOSIS — Z87898 Personal history of other specified conditions: Secondary | ICD-10-CM

## 2023-01-13 DIAGNOSIS — M461 Sacroiliitis, not elsewhere classified: Secondary | ICD-10-CM

## 2023-01-13 DIAGNOSIS — R748 Abnormal levels of other serum enzymes: Secondary | ICD-10-CM

## 2023-01-13 DIAGNOSIS — Z79899 Other long term (current) drug therapy: Secondary | ICD-10-CM

## 2023-01-13 DIAGNOSIS — F419 Anxiety disorder, unspecified: Secondary | ICD-10-CM

## 2023-01-13 DIAGNOSIS — M722 Plantar fascial fibromatosis: Secondary | ICD-10-CM

## 2023-01-13 DIAGNOSIS — Z1589 Genetic susceptibility to other disease: Secondary | ICD-10-CM

## 2023-01-13 DIAGNOSIS — M458 Ankylosing spondylitis sacral and sacrococcygeal region: Secondary | ICD-10-CM

## 2023-01-17 ENCOUNTER — Ambulatory Visit: Payer: 59 | Attending: Physician Assistant | Admitting: Physician Assistant

## 2023-01-17 ENCOUNTER — Encounter: Payer: Self-pay | Admitting: Physician Assistant

## 2023-01-17 VITALS — BP 132/80 | HR 81 | Resp 14 | Ht 70.0 in | Wt 182.2 lb

## 2023-01-17 DIAGNOSIS — Z79899 Other long term (current) drug therapy: Secondary | ICD-10-CM | POA: Diagnosis not present

## 2023-01-17 DIAGNOSIS — Z111 Encounter for screening for respiratory tuberculosis: Secondary | ICD-10-CM

## 2023-01-17 DIAGNOSIS — F32A Depression, unspecified: Secondary | ICD-10-CM

## 2023-01-17 DIAGNOSIS — R748 Abnormal levels of other serum enzymes: Secondary | ICD-10-CM | POA: Diagnosis not present

## 2023-01-17 DIAGNOSIS — F419 Anxiety disorder, unspecified: Secondary | ICD-10-CM

## 2023-01-17 DIAGNOSIS — Z87898 Personal history of other specified conditions: Secondary | ICD-10-CM

## 2023-01-17 DIAGNOSIS — Z1589 Genetic susceptibility to other disease: Secondary | ICD-10-CM

## 2023-01-17 DIAGNOSIS — M458 Ankylosing spondylitis sacral and sacrococcygeal region: Secondary | ICD-10-CM | POA: Diagnosis not present

## 2023-01-17 DIAGNOSIS — M461 Sacroiliitis, not elsewhere classified: Secondary | ICD-10-CM

## 2023-01-17 DIAGNOSIS — M722 Plantar fascial fibromatosis: Secondary | ICD-10-CM

## 2023-01-17 MED ORDER — ENBREL MINI 50 MG/ML ~~LOC~~ SOCT: SUBCUTANEOUS | 0 refills | Status: DC

## 2023-01-17 NOTE — Progress Notes (Signed)
Office Visit Note  Patient: Carl Lewis             Date of Birth: [birth year]           MRN: [id number]             PCP: Philip Aspen, Limmie Patricia, MD Referring: Philip Aspen, Almira Bar* Visit Date: 01/17/2023 Occupation: @GUAROCC @  Subjective:  Medication monitoring   History of Present Illness: Carl Lewis is a 27 y.o. male with history of ankylosing spondylitis.  Patient is prescribed Enbrel 50 mg sq injections once weekly.  He was last seen in the office on 04/15/22.  He presents today for a routine office visit and updated lab work since he requires a refill of Enbrel.  He is overdue for his Enbrel injection by 1 week.  He has noticed an increase stiffness in both hips due to the gap in therapy.  He has occasional discomfort in his right ankle and stiffness in his neck due to the position he sleeps in at night.  He denies any joint swelling.  He denies any achilles tendonitis or plantar fasciitis.  He denies any SI joint pain.  He denies any signs or symptoms of uveitis.  He has not had any recent or recurrent infections.     Activities of Daily Living:  Patient reports morning stiffness for 0 minutes.   Patient Reports nocturnal pain.  Difficulty dressing/grooming: Denies Difficulty climbing stairs: Denies Difficulty getting out of chair: Denies Difficulty using hands for taps, buttons, cutlery, and/or writing: Denies  Review of Systems  Constitutional:  Negative for fatigue.  HENT:  Negative for mouth sores and mouth dryness.   Eyes:  Negative for dryness.  Respiratory:  Negative for shortness of breath.   Cardiovascular:  Negative for chest pain and palpitations.  Gastrointestinal:  Negative for blood in stool, constipation and diarrhea.  Endocrine: Negative for increased urination.  Genitourinary:  Negative for involuntary urination.  Musculoskeletal:  Positive for joint pain and joint pain. Negative for gait problem, joint swelling, myalgias, muscle weakness, morning  stiffness, muscle tenderness and myalgias.  Skin:  Positive for rash. Negative for color change, hair loss and sensitivity to sunlight.  Allergic/Immunologic: Negative for susceptible to infections.  Neurological:  Negative for dizziness and headaches.  Hematological:  Positive for swollen glands.  Psychiatric/Behavioral:  Positive for depressed mood. Negative for sleep disturbance. The patient is nervous/anxious.     PMFS History:  Patient Active Problem List   Diagnosis Date Noted   Vitamin D deficiency 01/17/2020   Ankylosing spondylitis of thoracolumbar region (HCC) 05/17/2018   HLA B27 positive 05/17/2018   Plantar fasciitis, bilateral 05/17/2018   Sacroiliitis (HCC) 05/17/2018   Anxiety and depression 05/17/2018   Alcohol abuse 05/17/2018   Pain of upper abdomen 03/16/2018   Right flank pain, chronic 03/16/2018   Food intolerance in adult 03/16/2018    Past Medical History:  Diagnosis Date   Ankylosing spondylitis (HCC)    Anxiety and depression    Sacroiliitis (HCC)     Family History  Problem Relation Age of Onset   Asthma Mother    Anxiety disorder Mother    Arthritis Father    Healthy Sister    Colon cancer Neg Hx    Esophageal cancer Neg Hx    Inflammatory bowel disease Neg Hx    Liver disease Neg Hx    Pancreatic cancer Neg Hx    Rectal cancer Neg Hx    Stomach cancer Neg Hx  Past Surgical History:  Procedure Laterality Date   NO PAST SURGERIES     VASECTOMY  06/2021   Social History   Social History Narrative   Not on file   Immunization History  Administered Date(s) Administered   HPV 9-valent 12/18/2010, 02/16/2013, 07/05/2013, 07/01/2020   Influenza,inj,Quad PF,6+ Mos 01/16/2020   Influenza-Unspecified 11/07/2022   PFIZER(Purple Top)SARS-COV-2 Vaccination 08/07/2019, 09/06/2019, 03/21/2020   Tdap 07/01/2020     Objective: Vital Signs: BP 132/80 (BP Location: Left Arm, Patient Position: Sitting, Cuff Size: Normal)   Pulse 81   Resp 14    Ht 5\' 10"  (1.778 m)   Wt 182 lb 3.2 oz (82.6 kg)   BMI 26.14 kg/m    Physical Exam Vitals and nursing note reviewed.  Constitutional:      Appearance: He is well-developed.  HENT:     Head: Normocephalic and atraumatic.  Eyes:     Conjunctiva/sclera: Conjunctivae normal.     Pupils: Pupils are equal, round, and reactive to light.  Cardiovascular:     Rate and Rhythm: Normal rate and regular rhythm.     Heart sounds: Normal heart sounds.  Pulmonary:     Effort: Pulmonary effort is normal.     Breath sounds: Normal breath sounds.  Abdominal:     General: Bowel sounds are normal.     Palpations: Abdomen is soft.  Musculoskeletal:     Cervical back: Normal range of motion and neck supple.  Skin:    General: Skin is warm and dry.     Capillary Refill: Capillary refill takes less than 2 seconds.  Neurological:     Mental Status: He is alert and oriented to person, place, and time.  Psychiatric:        Behavior: Behavior normal.      Musculoskeletal Exam: C-spine has limited range of motion without rotation.  Good flexion and extension of the C-spine noted.  No midline spinal tenderness.  No SI joint tenderness.  Shoulder joints, elbow joints, wrist joints, MCPs, PIPs, DIPs have good range of motion with no synovitis.  Complete fist formation bilaterally.  Hip joints have good range of motion with some stiffness bilaterally.  Knee joints have good range of motion no warmth or effusion.  Ankle joints have good range of motion with some tenderness of the right ankle.  No evidence of Achilles tendinitis or plantar fasciitis at this time.  CDAI Exam: CDAI Score: -- Patient Global: --; Provider Global: -- Swollen: --; Tender: -- Joint Exam 01/17/2023   No joint exam has been documented for this visit   There is currently no information documented on the homunculus. Go to the Rheumatology activity and complete the homunculus joint exam.  Investigation: No additional  findings.  Imaging: No results found.  Recent Labs: Lab Results  Component Value Date   WBC 5.5 04/15/2022   HGB 15.0 04/15/2022   PLT 226 04/15/2022   NA 139 04/15/2022   K 3.7 04/15/2022   CL 102 04/15/2022   CO2 25 04/15/2022   GLUCOSE 89 04/15/2022   BUN 15 04/15/2022   CREATININE 0.92 04/15/2022   BILITOT 0.5 04/15/2022   ALKPHOS 56 03/10/2018   AST 23 04/15/2022   ALT 11 04/15/2022   PROT 7.7 04/15/2022   ALBUMIN 4.6 03/10/2018   CALCIUM 9.9 04/15/2022   GFRAA 127 09/05/2020   QFTBGOLDPLUS NEGATIVE 06/02/2021    Speciality Comments: No specialty comments available.  Procedures:  No procedures performed Allergies: American cockroach, Cat hair extract,  Molds & smuts, and Tobacco   Assessment / Plan:     Visit Diagnoses: Ankylosing spondylitis of sacral region [facility]): He has not had any signs or symptoms of a flare.  No midline spinal tenderness.  No SI joint tenderness upon palpation.  No synovitis or dactylitis noted.  He has not had any signs or symptoms of uveitis.  No evidence of Achilles tendinitis or plantar fasciitis.  He is prescribed Enbrel 50 mg subcutaneous injections once weekly.  He is overdue for his Enbrel injection by 1 week and has noticed some increase stiffness in both hips.  He has continued to tolerate Enbrel without any side effects or injection site reactions.  He is overdue to update lab work today.  Orders for TB Gold, CBC, and CMP were released today.  A refill of Enbrel will be sent to the pharmacy.  He was advised to notify us if he develops any signs or symptoms of a flare.  He will follow-up in the office in 5 months or sooner if needed.  High risk medication use - Enbrel 50 mg sq injections once weekly.  TB gold negative on 06/02/21. Order for TB gold released today.   Orders for CBC and CMP were released today.  His next lab work will be due in February and every 3 months to monitor for drug toxicity. No recent or recurrent infections.   Discussed the importance of holding Enbrel if he develops signs or symptoms of an infection and to resume once the infection has completely cleared. Plan: CBC with Differential/Platelet, COMPLETE METABOLIC PANEL WITH GFR, QuantiFERON-TB Gold Plus  Screening for tuberculosis - TB gold order released today. Plan: QuantiFERON-TB Gold Plus  Elevated CK: No muscular weakness.   HLA B27 positive  Plantar fasciitis, bilateral: Not currently symptomatic.   Sacroiliitis [facility]): No SI joint tenderness upon palpation today.  No nocturnal pain.   Other medical conditions are listed as follows:   Anxiety and depression  Orders: Orders Placed This Encounter  Procedures   CBC with Differential/Platelet   COMPLETE METABOLIC PANEL WITH GFR   QuantiFERON-TB Gold Plus   No orders of the defined types were placed in this encounter.  Follow-Up Instructions: Return in about 5 months (around 06/17/2023) for Ankylosing Spondylitis.   Gearldine Bienenstock, PA-C  Note - This record has been created using Dragon software.  Chart creation errors have been sought, but may not always  have been located. Such creation errors do not reflect on  the standard of medical care.

## 2023-01-17 NOTE — Addendum Note (Signed)
Addended by: Gearldine Bienenstock on: 01/17/2023 04:12 PM   Modules accepted: Orders

## 2023-01-17 NOTE — Patient Instructions (Signed)

## 2023-01-18 NOTE — Progress Notes (Signed)
CMP WNL Absolute neutrophils are borderline low.  MCH remains borderline elevated but stable. No medication changes recommended at this time.

## 2023-01-19 LAB — COMPLETE METABOLIC PANEL WITH GFR
AG Ratio: 1.7 (calc) (ref 1.0–2.5)
ALT: 14 U/L (ref 9–46)
AST: 32 U/L (ref 10–40)
Albumin: 4.7 g/dL (ref 3.6–5.1)
Alkaline phosphatase (APISO): 57 U/L (ref 36–130)
BUN: 9 mg/dL (ref 7–25)
CO2: 27 mmol/L (ref 20–32)
Calcium: 9.5 mg/dL (ref 8.6–10.3)
Chloride: 102 mmol/L (ref 98–110)
Creat: 0.83 mg/dL (ref 0.60–1.24)
Globulin: 2.7 g/dL (ref 1.9–3.7)
Glucose, Bld: 85 mg/dL (ref 65–99)
Potassium: 4.3 mmol/L (ref 3.5–5.3)
Sodium: 140 mmol/L (ref 135–146)
Total Bilirubin: 0.3 mg/dL (ref 0.2–1.2)
Total Protein: 7.4 g/dL (ref 6.1–8.1)
eGFR: 122 mL/min/{1.73_m2} (ref >=60)

## 2023-01-19 LAB — QUANTIFERON-TB GOLD PLUS
Mitogen-NIL: 6.95 [IU]/mL
NIL: 0.01 [IU]/mL
QuantiFERON-TB Gold Plus: NEGATIVE
TB1-NIL: 0 [IU]/mL
TB2-NIL: 0.01 [IU]/mL

## 2023-01-19 LAB — CBC WITH DIFFERENTIAL/PLATELET
Absolute Lymphocytes: 2030 {cells}/uL (ref 850–3900)
Absolute Monocytes: 439 {cells}/uL (ref 200–950)
Basophils Absolute: 70 {cells}/uL (ref 0–200)
Basophils Relative: 1.7 %
Eosinophils Absolute: 250 {cells}/uL (ref 15–500)
Eosinophils Relative: 6.1 %
HCT: 42.8 % (ref 38.5–50.0)
Hemoglobin: 14.6 g/dL (ref 13.2–17.1)
MCH: 33.6 pg — ABNORMAL HIGH (ref 27.0–33.0)
MCHC: 34.1 g/dL (ref 32.0–36.0)
MCV: 98.4 fL (ref 80.0–100.0)
MPV: 10.4 fL (ref 7.5–12.5)
Monocytes Relative: 10.7 %
Neutro Abs: 1312 {cells}/uL — ABNORMAL LOW (ref 1500–7800)
Neutrophils Relative %: 32 %
Platelets: 226 10*3/uL (ref 140–400)
RBC: 4.35 10*6/uL (ref 4.20–5.80)
RDW: 12 % (ref 11.0–15.0)
Total Lymphocyte: 49.5 %
WBC: 4.1 10*3/uL (ref 3.8–10.8)

## 2023-01-19 NOTE — Progress Notes (Signed)
TB gold negative

## 2023-02-09 ENCOUNTER — Other Ambulatory Visit: Payer: Self-pay | Admitting: *Deleted

## 2023-02-09 DIAGNOSIS — M458 Ankylosing spondylitis sacral and sacrococcygeal region: Secondary | ICD-10-CM

## 2023-02-09 MED ORDER — ENBREL MINI 50 MG/ML ~~LOC~~ SOCT: SUBCUTANEOUS | 0 refills | Status: DC

## 2023-02-09 NOTE — Telephone Encounter (Signed)
Received a fax from [employer]. Patient needs new prescription sent in. [facility] sent prescription out to the patient. After multiple attempts to deliver to patient the medication was sent back to [facility]. They need new prescription to send out to patient.   Last Fill: 01/17/2023  Labs: 01/17/2023 CMP WNL  Absolute neutrophils are borderline low.  MCH remains borderline elevated but stable. No medication changes recommended at this time.   TB Gold: 01/17/2023 Neg    Next Visit: 06/22/2023  Last Visit: 01/17/2023  DX: Ankylosing spondylitis of sacral region   Current Dose per office note 01/17/2023: Enbrel 50 mg sq injections once weekly.   Okay to refill Enbrel?

## 2023-03-21 ENCOUNTER — Telehealth: Payer: Self-pay | Admitting: Pharmacist

## 2023-03-21 ENCOUNTER — Telehealth: Payer: Self-pay | Admitting: Internal Medicine

## 2023-03-21 ENCOUNTER — Ambulatory Visit: Payer: 59 | Admitting: Family Medicine

## 2023-03-21 ENCOUNTER — Encounter: Payer: 59 | Admitting: Family Medicine

## 2023-03-21 NOTE — Telephone Encounter (Signed)
 Submitted a Prior Authorization renewal request to [facility] for ENBREL  via CoverMyMeds. Will update once we receive a response.  Key: [id number]   Per automated response: Submit Bill To Other Processor Or Primary Payer  Submitted a Prior Authorization renewal request to [employer] for ENBREL  via CoverMyMeds. Will update once we receive a response.  Key: [id number]

## 2023-03-21 NOTE — Telephone Encounter (Signed)
 Copied from CRM [id number]. Topic: Clinical - Medical Advice >> Mar 18, 2023  2:02 PM Fredrica W wrote: Reason for CRM: Patient called [place] area known for malaria and wanted was to to ask provider to see if anything can be prescribed prior. Scheduled appt for physical for Monday.

## 2023-03-21 NOTE — Telephone Encounter (Signed)
 Received notification from [facility] [employer] regarding a prior authorization for ENBREL . Authorization has been APPROVED from 04/18/2023 to 04/17/2024. Approval letter sent to scan center.  Authorization # [id number]  .Sherry Pennant, PharmD, MPH, BCPS, CPP Clinical Pharmacist (Rheumatology and Pulmonology)

## 2023-04-14 ENCOUNTER — Other Ambulatory Visit: Payer: Self-pay | Admitting: Physician Assistant

## 2023-04-14 DIAGNOSIS — M458 Ankylosing spondylitis sacral and sacrococcygeal region: Secondary | ICD-10-CM

## 2023-06-08 NOTE — Progress Notes (Deleted)
 Office Visit Note  Patient: Carl Lewis             Date of Birth: October 23, 1995           MRN: [id number]             PCP: Philip Aspen, Limmie Patricia, MD Referring: Philip Aspen, Almira Bar* Visit Date: 06/22/2023 Occupation: @GUAROCC @  Subjective:  No chief complaint on file.   History of Present Illness: Handsome Sites is a 28 y.o. male ***     Activities of Daily Living:  Patient reports morning stiffness for *** {minute/hour:19697}.   Patient {ACTIONS;DENIES/REPORTS:21021675::"Denies"} nocturnal pain.  Difficulty dressing/grooming: {ACTIONS;DENIES/REPORTS:21021675::"Denies"} Difficulty climbing stairs: {ACTIONS;DENIES/REPORTS:21021675::"Denies"} Difficulty getting out of chair: {ACTIONS;DENIES/REPORTS:21021675::"Denies"} Difficulty using hands for taps, buttons, cutlery, and/or writing: {ACTIONS;DENIES/REPORTS:21021675::"Denies"}  No Rheumatology ROS completed.   PMFS History:  Patient Active Problem List   Diagnosis Date Noted   Vitamin D deficiency 01/17/2020   Ankylosing spondylitis of thoracolumbar region (HCC) 05/17/2018   HLA B27 positive 05/17/2018   Plantar fasciitis, bilateral 05/17/2018   Sacroiliitis (HCC) 05/17/2018   Anxiety and depression 05/17/2018   Alcohol abuse 05/17/2018   Pain of upper abdomen 03/16/2018   Right flank pain, chronic 03/16/2018   Food intolerance in adult 03/16/2018    Past Medical History:  Diagnosis Date   Ankylosing spondylitis (HCC)    Anxiety and depression    Sacroiliitis (HCC)     Family History  Problem Relation Age of Onset   Asthma Mother    Anxiety disorder Mother    Arthritis Father    Healthy Sister    Colon cancer Neg Hx    Esophageal cancer Neg Hx    Inflammatory bowel disease Neg Hx    Liver disease Neg Hx    Pancreatic cancer Neg Hx    Rectal cancer Neg Hx    Stomach cancer Neg Hx    Past Surgical History:  Procedure Laterality Date   NO PAST SURGERIES     VASECTOMY  06/2021   Social History    Social History Narrative   Not on file   Immunization History  Administered Date(s) Administered   HPV 9-valent 12/18/2010, 02/16/2013, 07/05/2013, 07/01/2020   Influenza,inj,Quad PF,6+ Mos 01/16/2020   Influenza-Unspecified 11/07/2022   PFIZER(Purple Top)SARS-COV-2 Vaccination 08/07/2019, 09/06/2019, 03/21/2020   Tdap 07/01/2020     Objective: Vital Signs: There were no vitals taken for this visit.   Physical Exam   Musculoskeletal Exam: ***  CDAI Exam: CDAI Score: -- Patient Global: --; Provider Global: -- Swollen: --; Tender: -- Joint Exam 06/22/2023   No joint exam has been documented for this visit   There is currently no information documented on the homunculus. Go to the Rheumatology activity and complete the homunculus joint exam.  Investigation: No additional findings.  Imaging: No results found.  Recent Labs: Lab Results  Component Value Date   WBC 4.1 01/17/2023   HGB 14.6 01/17/2023   PLT 226 01/17/2023   NA 140 01/17/2023   K 4.3 01/17/2023   CL 102 01/17/2023   CO2 27 01/17/2023   GLUCOSE 85 01/17/2023   BUN 9 01/17/2023   CREATININE 0.83 01/17/2023   BILITOT 0.3 01/17/2023   ALKPHOS 56 03/10/2018   AST 32 01/17/2023   ALT 14 01/17/2023   PROT 7.4 01/17/2023   ALBUMIN 4.6 03/10/2018   CALCIUM 9.5 01/17/2023   GFRAA 127 09/05/2020   QFTBGOLDPLUS NEGATIVE 01/17/2023    Speciality Comments: No specialty comments available.  Procedures:  No procedures  performed Allergies: American cockroach, Cat dander, Molds & smuts, and Tobacco   Assessment / Plan:     Visit Diagnoses: No diagnosis found.  Orders: No orders of the defined types were placed in this encounter.  No orders of the defined types were placed in this encounter.   Face-to-face time spent with patient was *** minutes. Greater than 50% of time was spent in counseling and coordination of care.  Follow-Up Instructions: No follow-ups on file.   Ellen Henri,  CMA  Note - This record has been created using Animal nutritionist.  Chart creation errors have been sought, but may not always  have been located. Such creation errors do not reflect on  the standard of medical care.

## 2023-06-22 ENCOUNTER — Ambulatory Visit: Payer: 59 | Admitting: Rheumatology

## 2023-06-22 DIAGNOSIS — Z79899 Other long term (current) drug therapy: Secondary | ICD-10-CM

## 2023-06-22 DIAGNOSIS — R748 Abnormal levels of other serum enzymes: Secondary | ICD-10-CM

## 2023-06-22 DIAGNOSIS — M722 Plantar fascial fibromatosis: Secondary | ICD-10-CM

## 2023-06-22 DIAGNOSIS — F32A Depression, unspecified: Secondary | ICD-10-CM

## 2023-06-22 DIAGNOSIS — Z1589 Genetic susceptibility to other disease: Secondary | ICD-10-CM

## 2023-06-22 DIAGNOSIS — M461 Sacroiliitis, not elsewhere classified: Secondary | ICD-10-CM

## 2023-06-22 DIAGNOSIS — M458 Ankylosing spondylitis sacral and sacrococcygeal region: Secondary | ICD-10-CM

## 2023-07-05 ENCOUNTER — Ambulatory Visit (INDEPENDENT_AMBULATORY_CARE_PROVIDER_SITE_OTHER): Payer: 59 | Admitting: Allergy & Immunology

## 2023-07-05 VITALS — BP 118/76 | HR 91 | Temp 98.6°F | Resp 18

## 2023-07-05 DIAGNOSIS — J3089 Other allergic rhinitis: Secondary | ICD-10-CM

## 2023-07-05 DIAGNOSIS — J302 Other seasonal allergic rhinitis: Secondary | ICD-10-CM

## 2023-07-05 DIAGNOSIS — D849 Immunodeficiency, unspecified: Secondary | ICD-10-CM

## 2023-07-05 DIAGNOSIS — M455 Ankylosing spondylitis of thoracolumbar region: Secondary | ICD-10-CM | POA: Diagnosis not present

## 2023-07-05 MED ORDER — OLOPATADINE HCL 0.2 % OP SOLN: 1.0000 [drp] | Freq: Every day | OPHTHALMIC | 3 refills | Status: AC | PRN

## 2023-07-05 MED ORDER — LEVOCETIRIZINE DIHYDROCHLORIDE 5 MG PO TABS: 5.0000 mg | ORAL_TABLET | Freq: Every evening | ORAL | 3 refills | Status: AC

## 2023-07-05 MED ORDER — AZELASTINE-FLUTICASONE 137-50 MCG/ACT NA SUSP: 1.0000 | Freq: Two times a day (BID) | NASAL | 3 refills | Status: AC

## 2023-07-05 NOTE — Patient Instructions (Addendum)
 1. Seasonal and perennial allergic rhinitis (grasses, weeds, trees, molds, dust mite, cat, and tobacco) - Continue with the Dymista  two sprays up to twice daily. - Continue Xyzal  and use one tablet twice daily during flares.  - Consider allergy  shots for long term curative control.  2. Rash on bilateral calves  - Consider patch testing.  - These are placed on a Monday and then read on a Wednesday and Friday.   3. Return in about 1 year (around 07/04/2024). You can have the follow up appointment with Dr. Idolina Maker or a Nurse Practicioner (our Nurse Practitioners are excellent and always have Physician oversight!).    Please inform us  of any Emergency Department visits, hospitalizations, or changes in symptoms. Call us  before going to the ED for breathing or allergy  symptoms since we might be able to fit you in for a sick visit. Feel free to contact us  anytime with any questions, problems, or concerns.  It was a pleasure to see you again today!  Websites that have reliable patient information: 1. American Academy of Asthma, Allergy , and Immunology: www.aaaai.org 2. Food Allergy  Research and Education (FARE): foodallergy.org 3. Mothers of Asthmatics: http://www.asthmacommunitynetwork.org 4. [employer] of Allergy , Asthma, and Immunology: www.acaai.org      "Like" us  on Facebook and Instagram for our latest updates!      A healthy democracy works best when [employer] participate! Make sure you are registered to vote! If you have moved or changed any of your contact information, you will need to get this updated before voting! Scan the QR codes below to learn more!      Allergy  Shots  Allergies are the result of a chain reaction that starts in the immune system. Your immune system controls how your body defends itself. For instance, if you have an allergy  to pollen, your immune system identifies pollen as an invader or allergen. Your immune system overreacts by producing  antibodies called Immunoglobulin E (IgE). These antibodies travel to cells that release chemicals, causing an allergic reaction.  The concept behind allergy  immunotherapy, whether it is received in the form of shots or tablets, is that the immune system can be desensitized to specific allergens that trigger allergy  symptoms. Although it requires time and patience, the payback can be long-term relief. Allergy  injections contain a dilute solution of those substances that you are allergic to based upon your skin testing and allergy  history.   How Do Allergy  Shots Work?  Allergy  shots work much like a vaccine. Your body responds to injected amounts of a particular allergen given in increasing doses, eventually developing a resistance and tolerance to it. Allergy  shots can lead to decreased, minimal or no allergy  symptoms.  There generally are two phases: build-up and maintenance. Build-up often ranges from three to six months and involves receiving injections with increasing amounts of the allergens. The shots are typically given once or twice a week, though more rapid build-up schedules are sometimes used.  The maintenance phase begins when the most effective dose is reached. This dose is different for each person, depending on how allergic you are and your response to the build-up injections. Once the maintenance dose is reached, there are longer periods between injections, typically two to four weeks.  Occasionally doctors give cortisone-type shots that can temporarily reduce allergy  symptoms. These types of shots are different and should not be confused with allergy  immunotherapy shots.  Who Can Be Treated with Allergy  Shots?  Allergy  shots may be a good treatment approach for  people with allergic rhinitis (hay fever), allergic asthma, conjunctivitis (eye allergy ) or stinging insect allergy .   Before deciding to begin allergy  shots, you should consider:   The length of allergy  season and the  severity of your symptoms  Whether medications and/or changes to your environment can control your symptoms  Your desire to avoid long-term medication use  Time: allergy  immunotherapy requires a major time commitment  Cost: may vary depending on your insurance coverage  Allergy  shots for children age 32 and older are effective and often well tolerated. They might prevent the onset of new allergen sensitivities or the progression to asthma.  Allergy  shots are not started on patients who are pregnant but can be continued on patients who become pregnant while receiving them. In some patients with other medical conditions or who take certain common medications, allergy  shots may be of risk. It is important to mention other medications you talk to your allergist.   What are the two types of build-ups offered:   RUSH or Rapid Desensitization -- one day of injections lasting from 8:30-4:30pm, injections every 1 hour.  Approximately half of the build-up process is completed in that one day.  The following week, normal build-up is resumed, and this entails ~16 visits either weekly or twice weekly, until reaching your "maintenance dose" which is continued weekly until eventually getting spaced out to every month for a duration of 3 to 5 years. The regular build-up appointments are nurse visits where the injections are administered, followed by required monitoring for 30 minutes.    Traditional build-up -- weekly visits for 6 -12 months until reaching "maintenance dose", then continue weekly until eventually spacing out to every 4 weeks as above. At these appointments, the injections are administered, followed by required monitoring for 30 minutes.     Either way is acceptable, and both are equally effective. With the rush protocol, the advantage is that less time is spent here for injections overall AND you would also reach maintenance dosing faster (which is when the clinical benefit starts to become more  apparent). Not everyone is a candidate for rapid desensitization.   IF we proceed with the RUSH protocol, there are premedications which must be taken the day before and the day after the rush only (this includes antihistamines, steroids, and Singulair).  After the rush day, no prednisone or Singulair is required, and we just recommend antihistamines taken on your injection day.  What Is An Estimate of the Costs?  If you are interested in starting allergy  injections, please check with your insurance company about your coverage for both allergy  vial sets and allergy  injections.  Please do so prior to making the appointment to start injections.  The following are CPT codes to give to your insurance company. These are the amounts we BILL to the insurance company, but the amount YOU WILL PAY and WE RECEIVE IS SUBSTANTIALLY LESS and depends on the contracts we have with different insurance companies.   Amount Billed to Insurance One allergy  vial set  CPT 95165   $ 1200     Two allergy  vial set  CPT 95165   $ 2400     Three allergy  vial set  CPT 95165   $ 3600     One injection   CPT 95115   $ 35  Two injections   CPT 95117   $ 40 RUSH (Rapid Desensitization) CPT 95180 x 8 hours $500/hour  Regarding the allergy  injections, your co-pay may or may not  apply with each injection, so please confirm this with your insurance company. When you start allergy  injections, 1 or 2 sets of vials are made based on your allergies.  Not all patients can be on one set of vials. A set of vials lasts 6 months to a year depending on how quickly you can proceed with your build-up of your allergy  injections. Vials are personalized for each patient depending on their specific allergens.  How often are allergy  injection given during the build-up period?   Injections are given at least weekly during the build-up period until your maintenance dose is achieved. Per the doctor's discretion, you may have the option of getting  allergy  injections two times per week during the build-up period. However, there must be at least 48 hours between injections. The build-up period is usually completed within 6-12 months depending on your ability to schedule injections and for adjustments for reactions. When maintenance dose is reached, your injection schedule is gradually changed to every two weeks and later to every three weeks. Injections will then continue every 4 weeks. Usually, injections are continued for a total of 3-5 years.   When Will I Feel Better?  Some may experience decreased allergy  symptoms during the build-up phase. For others, it may take as long as 12 months on the maintenance dose. If there is no improvement after a year of maintenance, your allergist will discuss other treatment options with you.  If you aren't responding to allergy  shots, it may be because there is not enough dose of the allergen in your vaccine or there are missing allergens that were not identified during your allergy  testing. Other reasons could be that there are high levels of the allergen in your environment or major exposure to non-allergic triggers like tobacco smoke.  What Is the Length of Treatment?  Once the maintenance dose is reached, allergy  shots are generally continued for three to five years. The decision to stop should be discussed with your allergist at that time. Some people may experience a permanent reduction of allergy  symptoms. Others may relapse and a longer course of allergy  shots can be considered.  What Are the Possible Reactions?  The two types of adverse reactions that can occur with allergy  shots are local and systemic. Common local reactions include very mild redness and swelling at the injection site, which can happen immediately or several hours after. Report a delayed reaction from your last injection. These include arm swelling or runny nose, watery eyes or cough that occurs within 12-24 hours after injection. A  systemic reaction, which is less common, affects the entire body or a particular body system. They are usually mild and typically respond quickly to medications. Signs include increased allergy  symptoms such as sneezing, a stuffy nose or hives.   Rarely, a serious systemic reaction called anaphylaxis can develop. Symptoms include swelling in the throat, wheezing, a feeling of tightness in the chest, nausea or dizziness. Most serious systemic reactions develop within 30 minutes of allergy  shots. This is why it is strongly recommended you wait in your doctor's office for 30 minutes after your injections. Your allergist is trained to watch for reactions, and his or her staff is trained and equipped with the proper medications to identify and treat them.   Report to the nurse immediately if you experience any of the following symptoms: swelling, itching or redness of the skin, hives, watery eyes/nose, breathing difficulty, excessive sneezing, coughing, stomach pain, diarrhea, or light headedness. These symptoms may occur  within 15-20 minutes after injection and may require medication.   Who Should Administer Allergy  Shots?  The preferred location for receiving shots is your prescribing allergist's office. Injections can sometimes be given at another facility where the physician and staff are trained to recognize and treat reactions, and have received instructions by your prescribing allergist.  What if I am late for an injection?   Injection dose will be adjusted depending upon how many days or weeks you are late for your injection.   What if I am sick?   Please report any illness to the nurse before receiving injections. She may adjust your dose or postpone injections depending on your symptoms. If you have fever, flu, sinus infection or chest congestion it is best to postpone allergy  injections until you are better. Never get an allergy  injection if your asthma is causing you problems. If your symptoms  persist, seek out medical care to get your health problem under control.  What If I am or Become Pregnant:  Women that become pregnant should schedule an appointment with [facility] before receiving any further allergy  injections.

## 2023-07-05 NOTE — Progress Notes (Signed)
 FOLLOW UP  Date of Service/Encounter:  07/05/23   Assessment:   Seasonal and perennial allergic rhinitis   Ankylosing spondylitis - on Enbrel  weekly and doing well   Immunosuppressed status  Plan/Recommendations:   1. Seasonal and perennial allergic rhinitis (grasses, weeds, trees, molds, dust mite, cat, and tobacco) - Continue with the Dymista  two sprays up to twice daily. - Continue Xyzal  and use one tablet twice daily during flares.  - Consider allergy  shots for long term curative control.  2. Rash on bilateral calves  - Consider patch testing.  - These are placed on a Monday and then read on a Wednesday and Friday.   3. Return in about 1 year (around 07/04/2024). You can have the follow up appointment with Dr. Idolina Maker or a Nurse Practicioner (our Nurse Practitioners are excellent and always have Physician oversight!).    Subjective:   Carl Lewis is a 28 y.o. male presenting today for follow up of No chief complaint on file.   Carl Lewis has a history of the following: Patient Active Problem List   Diagnosis Date Noted   Vitamin D  deficiency 01/17/2020   Ankylosing spondylitis of thoracolumbar region (HCC) 05/17/2018   HLA B27 positive 05/17/2018   Plantar fasciitis, bilateral 05/17/2018   Sacroiliitis (HCC) 05/17/2018   Anxiety and depression 05/17/2018   Alcohol abuse 05/17/2018   Pain of upper abdomen 03/16/2018   Right flank pain, chronic 03/16/2018   Food intolerance in adult 03/16/2018    History obtained from: chart review and patient.  Discussed the use of AI scribe software for clinical note transcription with the patient and/or guardian, who gave verbal consent to proceed.  Carl Lewis is a 28 y.o. male presenting for a follow up visit.  He was last seen in October 2024.  At that time, we recommended restarting Dymista  as well as Xyzal .  We did talk about allergy  shots for long-term control.  Since last visit, he has been ok until he ran out of his  medications.  He manages his allergies with Dymista , using it prophylactically, especially during pollen season, and finds it effective if taken early. He occasionally uses Xyzal  but not frequently. He experiences some allergy  symptoms in the winter and when around cats. No frequent sinus infections or need for antibiotics.  He manages his ankylosing spondylitis with weekly Enbrel  injections administered at home. He has not required steroid bursts or prednisone for this condition.  He has a persistent rash on the backs of his calves, described as intensely itchy. The rash appears randomly and recurs in the same spots despite using a prescribed steroid cream. Patch testing in the past identified allergies to pollens and molds, but the rash persists. A dermatologist suggested it might be contact dermatitis, but no definitive diagnosis has been made.  He mentions attending several weddings, including one in [place] , indicating some travel plans.     Otherwise, there have been no changes to his past medical history, surgical history, family history, or social history.    Review of systems otherwise negative other than that mentioned in the HPI.    Objective:   Blood pressure 118/76, pulse 91, temperature 98.6 F (37 C), temperature source Temporal, resp. rate 18, SpO2 97%. There is no height or weight on file to calculate BMI.    Physical Exam Vitals reviewed.  Constitutional:      Appearance: He is well-developed.  HENT:     Head: Normocephalic and atraumatic.     Right  Ear: Tympanic membrane, ear canal and external ear normal. No drainage, swelling or tenderness. Tympanic membrane is not injected, scarred, erythematous, retracted or bulging.     Left Ear: Tympanic membrane, ear canal and external ear normal. No drainage, swelling or tenderness. Tympanic membrane is not injected, scarred, erythematous, retracted or bulging.     Nose: No nasal deformity, septal deviation,  mucosal edema or rhinorrhea.     Right Turbinates: Enlarged, swollen and pale.     Left Turbinates: Enlarged, swollen and pale.     Right Sinus: No maxillary sinus tenderness or frontal sinus tenderness.     Left Sinus: No maxillary sinus tenderness or frontal sinus tenderness.     Comments: No polyps.     Mouth/Throat:     Lips: Pink.     Mouth: Mucous membranes are moist. Mucous membranes are not pale and not dry.     Pharynx: Uvula midline.     Comments: Moderate cobblestoning.  Eyes:     General:        Right eye: No discharge.        Left eye: No discharge.     Conjunctiva/sclera: Conjunctivae normal.     Right eye: Right conjunctiva is not injected. No chemosis.    Left eye: Left conjunctiva is not injected. No chemosis.    Pupils: Pupils are equal, round, and reactive to light.  Cardiovascular:     Rate and Rhythm: Normal rate and regular rhythm.     Heart sounds: Normal heart sounds.  Pulmonary:     Effort: Pulmonary effort is normal. No tachypnea, accessory muscle usage or respiratory distress.     Breath sounds: Normal breath sounds. No wheezing, rhonchi or rales.     Comments: Moving air well in all lung fields.  Chest:     Chest wall: No tenderness.  Abdominal:     Tenderness: There is no abdominal tenderness. There is no guarding or rebound.  Lymphadenopathy:     Head:     Right side of head: No submandibular, tonsillar or occipital adenopathy.     Left side of head: No submandibular, tonsillar or occipital adenopathy.     Cervical: No cervical adenopathy.  Skin:    General: Skin is warm.     Capillary Refill: Capillary refill takes less than 2 seconds.     Coloration: Skin is not pale.     Findings: No abrasion, erythema, petechiae or rash. Rash is not papular, urticarial or vesicular.  Neurological:     Mental Status: He is alert.  Psychiatric:        Behavior: Behavior is cooperative.      Diagnostic studies: none      Drexel Gentles, MD  [facility] 

## 2023-07-07 ENCOUNTER — Encounter: Payer: Self-pay | Admitting: Allergy & Immunology

## 2023-08-26 ENCOUNTER — Other Ambulatory Visit: Payer: Self-pay | Admitting: *Deleted

## 2023-08-26 DIAGNOSIS — Z79899 Other long term (current) drug therapy: Secondary | ICD-10-CM

## 2023-08-26 NOTE — Progress Notes (Unsigned)
 Office Visit Note  Patient: Carl Lewis             Date of Birth: [birth year]           MRN: [id number]             PCP: Zilphia Hilt, Charyl Coppersmith, MD Referring: Zilphia Hilt, Achilles Achilles* Visit Date: 08/31/2023 Occupation: @GUAROCC @  Subjective:  No chief complaint on file.   History of Present Illness: Carl Lewis is a 28 y.o. male ***     Activities of Daily Living:  Patient reports morning stiffness for *** {minute/hour:19697}.   Patient {ACTIONS;DENIES/REPORTS:21021675::Denies} nocturnal pain.  Difficulty dressing/grooming: {ACTIONS;DENIES/REPORTS:21021675::Denies} Difficulty climbing stairs: {ACTIONS;DENIES/REPORTS:21021675::Denies} Difficulty getting out of chair: {ACTIONS;DENIES/REPORTS:21021675::Denies} Difficulty using hands for taps, buttons, cutlery, and/or writing: {ACTIONS;DENIES/REPORTS:21021675::Denies}  No Rheumatology ROS completed.   PMFS History:  Patient Active Problem List   Diagnosis Date Noted   Vitamin D  deficiency 01/17/2020   Ankylosing spondylitis of thoracolumbar region (HCC) 05/17/2018   HLA B27 positive 05/17/2018   Plantar fasciitis, bilateral 05/17/2018   Sacroiliitis (HCC) 05/17/2018   Anxiety and depression 05/17/2018   Alcohol abuse 05/17/2018   Pain of upper abdomen 03/16/2018   Right flank pain, chronic 03/16/2018   Food intolerance in adult 03/16/2018    Past Medical History:  Diagnosis Date   Ankylosing spondylitis (HCC)    Anxiety and depression    Sacroiliitis (HCC)     Family History  Problem Relation Age of Onset   Asthma Mother    Anxiety disorder Mother    Arthritis Father    Healthy Sister    Colon cancer Neg Hx    Esophageal cancer Neg Hx    Inflammatory bowel disease Neg Hx    Liver disease Neg Hx    Pancreatic cancer Neg Hx    Rectal cancer Neg Hx    Stomach cancer Neg Hx    Past Surgical History:  Procedure Laterality Date   NO PAST SURGERIES     VASECTOMY  06/2021   Social History    Social History Narrative   Not on file   Immunization History  Administered Date(s) Administered   HPV 9-valent 12/18/2010, 02/16/2013, 07/05/2013, 07/01/2020   Influenza,inj,Quad PF,6+ Mos 01/16/2020   Influenza-Unspecified 11/07/2022   PFIZER(Purple Top)SARS-COV-2 Vaccination 08/07/2019, 09/06/2019, 03/21/2020   Tdap 07/01/2020     Objective: Vital Signs: There were no vitals taken for this visit.   Physical Exam   Musculoskeletal Exam: ***  CDAI Exam: CDAI Score: -- Patient Global: --; Provider Global: -- Swollen: --; Tender: -- Joint Exam 08/31/2023   No joint exam has been documented for this visit   There is currently no information documented on the homunculus. Go to the Rheumatology activity and complete the homunculus joint exam.  Investigation: No additional findings.  Imaging: No results found.  Recent Labs: Lab Results  Component Value Date   WBC 4.1 01/17/2023   HGB 14.6 01/17/2023   PLT 226 01/17/2023   NA 140 01/17/2023   K 4.3 01/17/2023   CL 102 01/17/2023   CO2 27 01/17/2023   GLUCOSE 85 01/17/2023   BUN 9 01/17/2023   CREATININE 0.83 01/17/2023   BILITOT 0.3 01/17/2023   ALKPHOS 56 03/10/2018   AST 32 01/17/2023   ALT 14 01/17/2023   PROT 7.4 01/17/2023   ALBUMIN 4.6 03/10/2018   CALCIUM 9.5 01/17/2023   GFRAA 127 09/05/2020   QFTBGOLDPLUS NEGATIVE 01/17/2023    Speciality Comments: No specialty comments available.  Procedures:  No procedures  performed Allergies: American cockroach, Cat dander, Molds & smuts, and Tobacco   Assessment / Plan:     Visit Diagnoses: Ankylosing spondylitis of sacral region (HCC)  High risk medication use  HLA B27 positive  Plantar fasciitis, bilateral  Sacroiliitis (HCC)  Elevated CK  Anxiety and depression  History of alcohol use  Orders: No orders of the defined types were placed in this encounter.  No orders of the defined types were placed in this encounter.   Face-to-face  time spent with patient was *** minutes. Greater than 50% of time was spent in counseling and coordination of care.  Follow-Up Instructions: No follow-ups on file.   Romayne Clubs, PA-C  Note - This record has been created using Dragon software.  Chart creation errors have been sought, but may not always  have been located. Such creation errors do not reflect on  the standard of medical care.

## 2023-08-27 LAB — COMPREHENSIVE METABOLIC PANEL WITH GFR
AG Ratio: 1.9 (calc) (ref 1.0–2.5)
ALT: 14 U/L (ref 9–46)
AST: 22 U/L (ref 10–40)
Albumin: 4.7 g/dL (ref 3.6–5.1)
Alkaline phosphatase (APISO): 47 U/L (ref 36–130)
BUN: 9 mg/dL (ref 7–25)
CO2: 24 mmol/L (ref 20–32)
Calcium: 8.9 mg/dL (ref 8.6–10.3)
Chloride: 104 mmol/L (ref 98–110)
Creat: 1 mg/dL (ref 0.60–1.24)
Globulin: 2.5 g/dL (ref 1.9–3.7)
Glucose, Bld: 77 mg/dL (ref 65–99)
Potassium: 4 mmol/L (ref 3.5–5.3)
Sodium: 141 mmol/L (ref 135–146)
Total Bilirubin: 0.4 mg/dL (ref 0.2–1.2)
Total Protein: 7.2 g/dL (ref 6.1–8.1)
eGFR: 105 mL/min/{1.73_m2} (ref >=60)

## 2023-08-27 LAB — CBC WITH DIFFERENTIAL/PLATELET
Absolute Lymphocytes: 1940 {cells}/uL (ref 850–3900)
Absolute Monocytes: 352 {cells}/uL (ref 200–950)
Basophils Absolute: 52 {cells}/uL (ref 0–200)
Basophils Relative: 1.3 %
Eosinophils Absolute: 160 {cells}/uL (ref 15–500)
Eosinophils Relative: 4 %
HCT: 45.5 % (ref 38.5–50.0)
Hemoglobin: 14.7 g/dL (ref 13.2–17.1)
MCH: 32.3 pg (ref 27.0–33.0)
MCHC: 32.3 g/dL (ref 32.0–36.0)
MCV: 100 fL (ref 80.0–100.0)
MPV: 10 fL (ref 7.5–12.5)
Monocytes Relative: 8.8 %
Neutro Abs: 1496 {cells}/uL — ABNORMAL LOW (ref 1500–7800)
Neutrophils Relative %: 37.4 %
Platelets: 230 10*3/uL (ref 140–400)
RBC: 4.55 10*6/uL (ref 4.20–5.80)
RDW: 12.4 % (ref 11.0–15.0)
Total Lymphocyte: 48.5 %
WBC: 4 10*3/uL (ref 3.8–10.8)

## 2023-08-28 ENCOUNTER — Ambulatory Visit: Payer: Self-pay | Admitting: Physician Assistant

## 2023-08-31 ENCOUNTER — Encounter: Payer: Self-pay | Admitting: Physician Assistant

## 2023-08-31 ENCOUNTER — Ambulatory Visit: Attending: Physician Assistant | Admitting: Physician Assistant

## 2023-08-31 VITALS — BP 128/86 | HR 66 | Resp 16 | Ht 70.0 in | Wt 185.2 lb

## 2023-08-31 DIAGNOSIS — R748 Abnormal levels of other serum enzymes: Secondary | ICD-10-CM

## 2023-08-31 DIAGNOSIS — Z79899 Other long term (current) drug therapy: Secondary | ICD-10-CM

## 2023-08-31 DIAGNOSIS — M722 Plantar fascial fibromatosis: Secondary | ICD-10-CM

## 2023-08-31 DIAGNOSIS — F419 Anxiety disorder, unspecified: Secondary | ICD-10-CM

## 2023-08-31 DIAGNOSIS — F32A Depression, unspecified: Secondary | ICD-10-CM

## 2023-08-31 DIAGNOSIS — Z87898 Personal history of other specified conditions: Secondary | ICD-10-CM

## 2023-08-31 DIAGNOSIS — Z1589 Genetic susceptibility to other disease: Secondary | ICD-10-CM | POA: Diagnosis not present

## 2023-08-31 DIAGNOSIS — M458 Ankylosing spondylitis sacral and sacrococcygeal region: Secondary | ICD-10-CM | POA: Diagnosis not present

## 2023-08-31 DIAGNOSIS — M461 Sacroiliitis, not elsewhere classified: Secondary | ICD-10-CM

## 2023-08-31 MED ORDER — ENBREL MINI 50 MG/ML ~~LOC~~ SOCT: SUBCUTANEOUS | 0 refills | Status: DC

## 2023-08-31 NOTE — Patient Instructions (Signed)

## 2023-09-21 ENCOUNTER — Telehealth: Payer: Self-pay | Admitting: Rheumatology

## 2023-09-21 NOTE — Telephone Encounter (Signed)
 Patient called stating his Enbrel  medication is delayed and he is due to take his injection.  Patient requested a sample until he receives his prescription.

## 2023-09-22 ENCOUNTER — Telehealth: Payer: Self-pay

## 2023-09-22 NOTE — Telephone Encounter (Signed)
 Spoke with patient and he states the delay with his Enbrel  is due to a switch with the speciality pharmacy the insurance required. Patient states he is awaiting a call back from the pharmacy. Patient advised he may come by the office to pick up a sample. Sample reserved in fridge for patient.

## 2023-09-22 NOTE — Telephone Encounter (Signed)
 Duplicate

## 2023-09-22 NOTE — Telephone Encounter (Signed)
 Medication Samples have been provided to the patient.  Drug name: Enbrel  Mini       Strength: 50mg /ml        Qty: 1  LOT: [id number]  Exp.Date: 11/05/2024  Dosing instructions: Inject one cartridge into the skin every 7 days.

## 2023-09-29 ENCOUNTER — Telehealth: Payer: Self-pay | Admitting: Pharmacist

## 2023-09-29 NOTE — Telephone Encounter (Signed)
 Submitted an URGENT Prior Authorization RENEWAL request to [employer] for ENBREL  via FAX on CoverMyMeds. Will update once we receive a response.  KeyBETHA MUTA   Of note PA for Enbrel  was already renewed in Jan 2025 through 04/08/2024. May be days supply rejection

## 2023-09-29 NOTE — Telephone Encounter (Signed)
 Received fax - prior authorization request cancelled. PA is already approved from 04/18/2023 through 04/17/2024

## 2023-12-29 ENCOUNTER — Ambulatory Visit: Admitting: Internal Medicine

## 2023-12-29 ENCOUNTER — Other Ambulatory Visit: Payer: Self-pay

## 2023-12-29 DIAGNOSIS — Z111 Encounter for screening for respiratory tuberculosis: Secondary | ICD-10-CM

## 2023-12-29 DIAGNOSIS — Z79899 Other long term (current) drug therapy: Secondary | ICD-10-CM

## 2023-12-29 DIAGNOSIS — M458 Ankylosing spondylitis sacral and sacrococcygeal region: Secondary | ICD-10-CM

## 2023-12-29 MED ORDER — ENBREL MINI 50 MG/ML ~~LOC~~ SOCT: SUBCUTANEOUS | 0 refills | Status: AC

## 2023-12-29 NOTE — Telephone Encounter (Signed)
 Refill request received via fax from [facility]  for Enbrel   Last Fill: 08/31/2023  Labs: 08/26/2023 CMP WNL Absolute neutrophils are borderline elevated but have improved. Rest of CBC WNL.   TB Gold: 01/17/2023 TB gold negative   Next Visit: 01/31/2024  Last Visit: 08/31/2023  DX:Ankylosing spondylitis of sacral region [facility])   Current Dose per office note 08/31/2023: Enbrel  50 mg sq injections once weekly.   Patient states he will come into the office as soon as he can to get lab work drawn. Will pend CBC, CMP, and TB Gold because it will be due next month.   Okay to refill Enbrel ?

## 2023-12-30 ENCOUNTER — Ambulatory Visit: Admitting: Adult Health

## 2023-12-30 ENCOUNTER — Other Ambulatory Visit (HOSPITAL_COMMUNITY)
Admission: RE | Admit: 2023-12-30 | Discharge: 2023-12-30 | Disposition: A | Discharge status: Home / Self Care | Source: Ambulatory Visit | Attending: Adult Health | Admitting: Adult Health

## 2023-12-30 ENCOUNTER — Encounter: Payer: Self-pay | Admitting: Adult Health

## 2023-12-30 VITALS — BP 120/80 | HR 82 | Temp 97.0°F | Ht 70.0 in | Wt 184.0 lb

## 2023-12-30 DIAGNOSIS — Z113 Encounter for screening for infections with a predominantly sexual mode of transmission: Secondary | ICD-10-CM

## 2023-12-30 NOTE — Progress Notes (Signed)
 Subjective:    Patient ID: Carl Lewis, male    DOB: Oct 04, 1995, 28 y.o.   MRN: [id number]  HPI  Discussed the use of AI scribe software for clinical note transcription with the patient, who gave verbal consent to proceed.  History of Present Illness   Carl Lewis is a 28 year old male who presents for STD testing after a partner tested positive for HSV  2 antibodies.  He has been in a relationship with his current partner for two months. Another partner of his current partner tested positive for HSV type 2 antibodies. Neither his current partner nor his other partner have shown symptoms of HSV infection.  He has no symptoms such as lesions. Previous herpes simplex virus testing in April was negative for type 2 and positive for type 1. He has not been using protection recently and has a past STD, which was asymptomatic and treated.       Review of Systems See HPI   Past Medical History:  Diagnosis Date   Ankylosing spondylitis (HCC)    Anxiety and depression    Sacroiliitis     Social History   Socioeconomic History   Marital status: Single    Spouse name: Not on file   Number of children: 0   Years of education: Not on file   Highest education level: Not on file  Occupational History   Occupation: Automotive engineer  Tobacco Use   Smoking status: Never    Passive exposure: Never   Smokeless tobacco: Never  Vaping Use   Vaping status: Never Used  Substance and Sexual Activity   Alcohol use: Yes    Comment: Occasionally   Drug use: Yes    Types: Marijuana    Comment: Shrooms on occasion   Sexual activity: Yes    Partners: Female  Other Topics Concern   Not on file  Social History Narrative   Not on file   Social Drivers of Health   Financial Resource Strain: Not on file  Food Insecurity: Not on file  Transportation Needs: Not on file  Physical Activity: Not on file  Stress: Not on file  Social Connections: Not on file  Intimate Partner Violence: Not  on file    Past Surgical History:  Procedure Laterality Date   NO PAST SURGERIES     VASECTOMY  06/2021    Family History  Problem Relation Age of Onset   Asthma Mother    Anxiety disorder Mother    Arthritis Father    Healthy Sister    Colon cancer Neg Hx    Esophageal cancer Neg Hx    Inflammatory bowel disease Neg Hx    Liver disease Neg Hx    Pancreatic cancer Neg Hx    Rectal cancer Neg Hx    Stomach cancer Neg Hx     Allergies  Allergen Reactions   American Cockroach    Cat Dander    Molds & Smuts    Tobacco     Current Outpatient Medications on File Prior to Visit  Medication Sig Dispense Refill   Azelastine -Fluticasone  137-50 MCG/ACT SUSP Place 1 spray into the nose in the morning and at bedtime. 69 g 3   buPROPion (WELLBUTRIN SR) 100 MG 12 hr tablet Take 100 mg by mouth daily.     cetirizine (ZYRTEC) 10 MG tablet Take 10 mg by mouth as needed.     ENBREL  MINI 50 MG/ML injection INSERT 1 MINI CARTRIDGE INTO AUTOINJECTOR AND INJECT  50 MG UNDER THE SKIN EVERY 7 DAYS. 4 mL 0   fluocinonide ointment (LIDEX) 0.05 % Apply topically 2 (two) times daily as needed.     ibuprofen (ADVIL,MOTRIN) 200 MG tablet Take 200 mg by mouth every 6 (six) hours as needed for fever or mild pain.     lamoTRIgine (LAMICTAL) 100 MG tablet      levocetirizine (XYZAL ) 5 MG tablet Take 1 tablet (5 mg total) by mouth every evening. 90 tablet 3   mupirocin ointment (BACTROBAN) 2 % Apply to the affected areas on the scrotum daily as needed.     Olopatadine  HCl (PATADAY ) 0.2 % SOLN Place 1 drop into both eyes daily as needed. 7.5 mL 3   sildenafil (VIAGRA) 25 MG tablet Take 25 mg by mouth as needed for erectile dysfunction.     tacrolimus (PROTOPIC) 0.1 % ointment Apply to the sensitive areas on the body daily as needed.     triamcinolone cream (KENALOG) 0.1 % Apply topically 2 (two) times daily as needed.     No current facility-administered medications on file prior to visit.    BP 120/80    Pulse 82   Temp (!) 97 F (36.1 C) (Oral)   Ht 5' 10 (1.778 m)   Wt 184 lb (83.5 kg)   SpO2 97%   BMI 26.40 kg/m       Objective:   Physical Exam Vitals and nursing note reviewed.  Constitutional:      Appearance: Normal appearance.  Skin:    General: Skin is warm and dry.     Capillary Refill: Capillary refill takes less than 2 seconds.  Neurological:     General: No focal deficit present.     Mental Status: He is alert and oriented to person, place, and time.  Psychiatric:        Mood and Affect: Mood is anxious.        Behavior: Behavior normal.        Thought Content: Thought content normal.        Judgment: Judgment normal.        Assessment & Plan:  1. Screening examination for STD (sexually transmitted disease) (Primary) - Encouraged safe sex practices  - HIV Antibody (routine testing w rflx); Future - RPR; Future - HSV(herpes simplex vrs) 1+2 ab-IgG; Future - Urine cytology ancillary only  Darleene Shape, NP

## 2023-12-31 LAB — RPR: RPR Ser Ql: NONREACTIVE

## 2023-12-31 LAB — HIV ANTIBODY (ROUTINE TESTING W REFLEX)
HIV 1&2 Ab, 4th Generation: NONREACTIVE
HIV FINAL INTERPRETATION: NEGATIVE

## 2024-01-01 ENCOUNTER — Ambulatory Visit: Payer: Self-pay | Admitting: Adult Health

## 2024-01-02 LAB — URINE CYTOLOGY ANCILLARY ONLY
Chlamydia: NEGATIVE
Comment: NEGATIVE
Comment: NEGATIVE
Comment: NORMAL
Neisseria Gonorrhea: NEGATIVE
Trichomonas: NEGATIVE

## 2024-01-10 ENCOUNTER — Encounter: Payer: Self-pay | Admitting: Allergy & Immunology

## 2024-01-17 NOTE — Progress Notes (Unsigned)
 Office Visit Note  Patient: Carl Lewis             Date of Birth: Jan 25, 1996           MRN: [id number]             PCP: Theophilus Andrews, Tully GRADE, MD Referring: Theophilus Andrews, Jonna* Visit Date: 01/31/2024 Occupation: Data Unavailable  Subjective:  Medication monitoring   History of Present Illness: Carl Lewis is a 28 y.o. male with history of ankylosing spondylitis.  Patient remains on Enbrel  50 mg sq injections once weekly.  Patient states his copay assistance has run out for the year and he only has a total of 3 doses left.  He is planning to administer 1 dose today.  Patient states that he experiences a recurrence of pain and inflammation affecting a tendon in his right foot if he is several days overdue for Enbrel .  He denies any increase spinal pain.  Denies any SI joint discomfort.  He denies any Achilles tendinitis or plantar fasciitis.  Patient denies any recent or recurrent infections.    Activities of Daily Living:  Patient reports morning stiffness for 5 minutes.   Patient Denies nocturnal pain.  Difficulty dressing/grooming: Denies Difficulty climbing stairs: Denies Difficulty getting out of chair: Denies Difficulty using hands for taps, buttons, cutlery, and/or writing: Denies  Review of Systems  Constitutional:  Negative for fatigue.  HENT:  Negative for mouth sores and mouth dryness.   Eyes:  Positive for dryness.  Respiratory:  Negative for shortness of breath.   Cardiovascular:  Negative for chest pain and palpitations.  Gastrointestinal:  Negative for blood in stool, constipation and diarrhea.  Endocrine: Negative for increased urination.  Genitourinary:  Negative for involuntary urination.  Musculoskeletal:  Positive for joint pain, joint pain, joint swelling, myalgias, morning stiffness and myalgias. Negative for gait problem, muscle weakness and muscle tenderness.  Skin:  Positive for rash. Negative for color change, hair loss and sensitivity to  sunlight.  Allergic/Immunologic: Negative for susceptible to infections.  Neurological:  Negative for dizziness and headaches.  Hematological:  Negative for swollen glands.  Psychiatric/Behavioral:  Negative for depressed mood and sleep disturbance. The patient is not nervous/anxious.     PMFS History:  Patient Active Problem List   Diagnosis Date Noted   Vitamin D  deficiency 01/17/2020   Ankylosing spondylitis of thoracolumbar region (HCC) 05/17/2018   HLA B27 positive 05/17/2018   Plantar fasciitis, bilateral 05/17/2018   Sacroiliitis 05/17/2018   Anxiety and depression 05/17/2018   Alcohol abuse 05/17/2018   Pain of upper abdomen 03/16/2018   Right flank pain, chronic 03/16/2018   Food intolerance in adult 03/16/2018    Past Medical History:  Diagnosis Date   Ankylosing spondylitis (HCC)    Anxiety and depression    Sacroiliitis     Family History  Problem Relation Age of Onset   Asthma Mother    Anxiety disorder Mother    Arthritis Father    Healthy Sister    Colon cancer Neg Hx    Esophageal cancer Neg Hx    Inflammatory bowel disease Neg Hx    Liver disease Neg Hx    Pancreatic cancer Neg Hx    Rectal cancer Neg Hx    Stomach cancer Neg Hx    Past Surgical History:  Procedure Laterality Date   NO PAST SURGERIES     VASECTOMY  06/2021   Social History   Tobacco Use   Smoking status: Never  Passive exposure: Never   Smokeless tobacco: Never  Vaping Use   Vaping status: Never Used  Substance Use Topics   Alcohol use: Yes    Comment: Occasionally   Drug use: Yes    Types: Marijuana    Comment: Shrooms on occasion   Social History   Social History Narrative   Not on file     Immunization History  Administered Date(s) Administered   HPV 9-valent 12/18/2010, 02/16/2013, 07/05/2013, 07/01/2020   Influenza,inj,Quad PF,6+ Mos 01/16/2020   Influenza-Unspecified 11/07/2022   PFIZER(Purple Top)SARS-COV-2 Vaccination 08/07/2019, 09/06/2019,  03/21/2020   Tdap 07/01/2020     Objective: Vital Signs: BP 126/81   Pulse 76   Temp 98.5 F (36.9 C)   Resp 14   Ht 5' 10 (1.778 m)   Wt 178 lb 12.8 oz (81.1 kg)   BMI 25.66 kg/m    Physical Exam Vitals and nursing note reviewed.  Constitutional:      Appearance: He is well-developed.  HENT:     Head: Normocephalic and atraumatic.  Eyes:     Conjunctiva/sclera: Conjunctivae normal.     Pupils: Pupils are equal, round, and reactive to light.  Cardiovascular:     Rate and Rhythm: Normal rate and regular rhythm.     Heart sounds: Normal heart sounds.  Pulmonary:     Effort: Pulmonary effort is normal.     Breath sounds: Normal breath sounds.  Abdominal:     General: Bowel sounds are normal.     Palpations: Abdomen is soft.  Musculoskeletal:     Cervical back: Normal range of motion and neck supple.  Skin:    General: Skin is warm and dry.     Capillary Refill: Capillary refill takes less than 2 seconds.  Neurological:     Mental Status: He is alert and oriented to person, place, and time.  Psychiatric:        Behavior: Behavior normal.      Musculoskeletal Exam: C-spine, thoracic spine, lumbar spine have good range of motion.  No midline spinal tenderness.  No SI joint tenderness.  Shoulder joints, elbow joints, wrist joints, MCPs, PIPs, DIPs have good range of motion with no synovitis.  Complete fist formation bilaterally.  Hip joints have good range of motion with no groin pain.  Knee joints have good range of motion no warmth or effusion.  Ankle joints have good range of motion no tenderness or joint swelling.  No evidence of Achilles tendinitis or plantar fasciitis.   CDAI Exam: CDAI Score: -- Patient Global: --; Provider Global: -- Swollen: --; Tender: -- Joint Exam 01/31/2024   No joint exam has been documented for this visit   There is currently no information documented on the homunculus. Go to the Rheumatology activity and complete the homunculus joint  exam.  Investigation: No additional findings.  Imaging: No results found.  Recent Labs: Lab Results  Component Value Date   WBC 4.0 08/26/2023   HGB 14.7 08/26/2023   PLT 230 08/26/2023   NA 141 08/26/2023   K 4.0 08/26/2023   CL 104 08/26/2023   CO2 24 08/26/2023   GLUCOSE 77 08/26/2023   BUN 9 08/26/2023   CREATININE 1.00 08/26/2023   BILITOT 0.4 08/26/2023   ALKPHOS 56 03/10/2018   AST 22 08/26/2023   ALT 14 08/26/2023   PROT 7.2 08/26/2023   ALBUMIN 4.6 03/10/2018   CALCIUM 8.9 08/26/2023   GFRAA 127 09/05/2020   QFTBGOLDPLUS NEGATIVE 01/17/2023    Speciality  Comments: No specialty comments available.  Procedures:  No procedures performed Allergies: American cockroach, Cat dander, Molds & smuts, and Tobacco   Assessment / Plan:     Visit Diagnoses: Ankylosing spondylitis of sacral region [facility]): He has no midline spinal tenderness.  No SI joint tenderness.  No synovitis or dactylitis noted on examination today.  No evidence of Achilles tendinitis or plantar fasciitis.  He has some tenderness on the anterior-medial aspect of the right ankle.  Patient remains on Enbrel  50 mg subcutaneous injections once weekly.  He is tolerating Enbrel  without any side effects or injection site reactions.  Enbrel  has continued to alleviate his symptoms.  No medication changes will be made at this time.  Patient was provided samples of Enbrel  today since his co-pay assistance has run out for the year.  He will notify us  if he develops any new or worsening symptoms. He will follow up in 5 months or sooner if needed.   High risk medication use - Enbrel  50 mg sq injections once weekly. CBC and CMP updated on 08/26/23.  Orders for CBC and CMP released today.   TB gold negative 01/17/23. Order for TB gold released today.  No recent or recurrent infections.  Discussed the importance of holding enbrel  if he develops signs or symptoms of an infection and to resume once the infection has completely  cleared.   - Plan: CBC with Differential/Platelet, Comprehensive metabolic panel with GFR, QuantiFERON-TB Gold Plus  Screening for tuberculosis -Order for TB gold released today.  Plan: QuantiFERON-TB Gold Plus  HLA B27 positive  Plantar fasciitis, bilateral: Not currently symptomatic.  Sacroiliitis: No SI joint tenderness upon palpation.  No nocturnal pain.  Elevated CK: No muscular weakness.  Anxiety and depression  History of alcohol use    Orders: Orders Placed This Encounter  Procedures   CBC with Differential/Platelet   Comprehensive metabolic panel with GFR   QuantiFERON-TB Gold Plus   No orders of the defined types were placed in this encounter.    Follow-Up Instructions: Return in about 5 months (around 06/30/2024) for Ankylosing Spondylitis.   Carl CHRISTELLA Craze, PA-C  Note - This record has been created using Dragon software.  Chart creation errors have been sought, but may not always  have been located. Such creation errors do not reflect on  the standard of medical care.

## 2024-01-31 ENCOUNTER — Ambulatory Visit: Attending: Physician Assistant | Admitting: Physician Assistant

## 2024-01-31 ENCOUNTER — Encounter: Payer: Self-pay | Admitting: Physician Assistant

## 2024-01-31 VITALS — BP 126/81 | HR 76 | Temp 98.5°F | Resp 14 | Ht 70.0 in | Wt 178.8 lb

## 2024-01-31 DIAGNOSIS — R748 Abnormal levels of other serum enzymes: Secondary | ICD-10-CM

## 2024-01-31 DIAGNOSIS — Z1589 Genetic susceptibility to other disease: Secondary | ICD-10-CM | POA: Diagnosis not present

## 2024-01-31 DIAGNOSIS — Z87898 Personal history of other specified conditions: Secondary | ICD-10-CM

## 2024-01-31 DIAGNOSIS — M458 Ankylosing spondylitis sacral and sacrococcygeal region: Secondary | ICD-10-CM | POA: Diagnosis not present

## 2024-01-31 DIAGNOSIS — M722 Plantar fascial fibromatosis: Secondary | ICD-10-CM

## 2024-01-31 DIAGNOSIS — Z79899 Other long term (current) drug therapy: Secondary | ICD-10-CM | POA: Diagnosis not present

## 2024-01-31 DIAGNOSIS — F32A Depression, unspecified: Secondary | ICD-10-CM

## 2024-01-31 DIAGNOSIS — M461 Sacroiliitis, not elsewhere classified: Secondary | ICD-10-CM

## 2024-01-31 DIAGNOSIS — Z111 Encounter for screening for respiratory tuberculosis: Secondary | ICD-10-CM

## 2024-01-31 DIAGNOSIS — F419 Anxiety disorder, unspecified: Secondary | ICD-10-CM

## 2024-01-31 NOTE — Progress Notes (Signed)
 Medication Samples have been provided to the patient.  Drug name: Enbrel  Mini 50mg /mL cartridges Qty: 4 cartridges LOT: [id number] Exp.Date: 10/05/2025  Dosing instructions: Inject 50 mg into the skin once weekly  The patient has been instructed regarding the correct time, dose, and frequency of taking this medication, including desired effects and most common side effects.   Sherry Pennant, PharmD, MPH, BCPS, CPP Clinical Pharmacist [facility])

## 2024-02-01 ENCOUNTER — Ambulatory Visit: Payer: Self-pay | Admitting: Physician Assistant

## 2024-02-01 NOTE — Progress Notes (Signed)
 CBC and CMP WNL

## 2024-02-02 LAB — CBC WITH DIFFERENTIAL/PLATELET
Absolute Lymphocytes: 1677 {cells}/uL (ref 850–3900)
Absolute Monocytes: 476 {cells}/uL (ref 200–950)
Basophils Absolute: 59 {cells}/uL (ref 0–200)
Basophils Relative: 1.5 %
Eosinophils Absolute: 129 {cells}/uL (ref 15–500)
Eosinophils Relative: 3.3 %
HCT: 45.2 % (ref 39.4–51.1)
Hemoglobin: 15.3 g/dL (ref 13.2–17.1)
MCH: 33.4 pg — ABNORMAL HIGH (ref 27.0–33.0)
MCHC: 33.8 g/dL (ref 31.6–35.4)
MCV: 98.7 fL (ref 81.4–101.7)
MPV: 10.5 fL (ref 7.5–12.5)
Monocytes Relative: 12.2 %
Neutro Abs: 1560 {cells}/uL (ref 1500–7800)
Neutrophils Relative %: 40 %
Platelets: 232 Thousand/uL (ref 140–400)
RBC: 4.58 Million/uL (ref 4.20–5.80)
RDW: 12.2 % (ref 11.0–15.0)
Total Lymphocyte: 43 %
WBC: 3.9 Thousand/uL (ref 3.8–10.8)

## 2024-02-02 LAB — COMPREHENSIVE METABOLIC PANEL WITH GFR
AG Ratio: 1.8 (calc) (ref 1.0–2.5)
ALT: 14 U/L (ref 9–46)
AST: 33 U/L (ref 10–40)
Albumin: 4.9 g/dL (ref 3.6–5.1)
Alkaline phosphatase (APISO): 38 U/L (ref 36–130)
BUN: 8 mg/dL (ref 7–25)
CO2: 27 mmol/L (ref 20–32)
Calcium: 9.8 mg/dL (ref 8.6–10.3)
Chloride: 101 mmol/L (ref 98–110)
Creat: 0.96 mg/dL (ref 0.60–1.24)
Globulin: 2.8 g/dL (ref 1.9–3.7)
Glucose, Bld: 86 mg/dL (ref 65–99)
Potassium: 4.1 mmol/L (ref 3.5–5.3)
Sodium: 139 mmol/L (ref 135–146)
Total Bilirubin: 0.9 mg/dL (ref 0.2–1.2)
Total Protein: 7.7 g/dL (ref 6.1–8.1)
eGFR: 110 mL/min/1.73m2 (ref >=60)

## 2024-02-02 LAB — QUANTIFERON-TB GOLD PLUS
Mitogen-NIL: 8.1 [IU]/mL
NIL: 0.01 [IU]/mL
QuantiFERON-TB Gold Plus: NEGATIVE
TB1-NIL: 0 [IU]/mL
TB2-NIL: 0 [IU]/mL

## 2024-02-05 NOTE — Progress Notes (Signed)
 TB gold negative

## 2024-07-02 ENCOUNTER — Ambulatory Visit: Admitting: Physician Assistant
